# Patient Record
Sex: Female | Born: 1975 | Race: Black or African American | Marital: Married | Smoking: Never smoker
Health system: Southern US, Community
[De-identification: ages and names within clinical notes are randomized; demographics above are authoritative.]

## PROBLEM LIST (undated history)

## (undated) DIAGNOSIS — N83209 Unspecified ovarian cyst, unspecified side: Secondary | ICD-10-CM

## (undated) DIAGNOSIS — N200 Calculus of kidney: Secondary | ICD-10-CM

## (undated) HISTORY — PX: WISDOM TOOTH EXTRACTION: SHX21

## (undated) HISTORY — PX: LITHOTRIPSY: SUR834

---

## 2004-10-22 HISTORY — PX: SINUS SURGERY WITH INSTATRAK: SHX5215

## 2011-12-12 ENCOUNTER — Ambulatory Visit: Payer: Self-pay | Admitting: Family Medicine

## 2016-01-31 DIAGNOSIS — J040 Acute laryngitis: Secondary | ICD-10-CM | POA: Insufficient documentation

## 2016-01-31 DIAGNOSIS — Z9109 Other allergy status, other than to drugs and biological substances: Secondary | ICD-10-CM | POA: Insufficient documentation

## 2016-08-14 DIAGNOSIS — N200 Calculus of kidney: Secondary | ICD-10-CM | POA: Insufficient documentation

## 2016-08-21 DIAGNOSIS — G2581 Restless legs syndrome: Secondary | ICD-10-CM | POA: Insufficient documentation

## 2016-08-21 DIAGNOSIS — J301 Allergic rhinitis due to pollen: Secondary | ICD-10-CM | POA: Insufficient documentation

## 2016-08-21 DIAGNOSIS — J4530 Mild persistent asthma, uncomplicated: Secondary | ICD-10-CM | POA: Insufficient documentation

## 2016-11-12 DIAGNOSIS — J0101 Acute recurrent maxillary sinusitis: Secondary | ICD-10-CM | POA: Insufficient documentation

## 2017-05-13 DIAGNOSIS — N201 Calculus of ureter: Secondary | ICD-10-CM | POA: Insufficient documentation

## 2017-07-09 ENCOUNTER — Other Ambulatory Visit: Payer: Self-pay | Admitting: Allergy

## 2017-07-09 ENCOUNTER — Ambulatory Visit
Admission: RE | Admit: 2017-07-09 | Discharge: 2017-07-09 | Disposition: A | Discharge status: Home / Self Care | Payer: BLUE CROSS/BLUE SHIELD | Source: Ambulatory Visit | Attending: Allergy | Admitting: Allergy

## 2017-07-09 DIAGNOSIS — J4530 Mild persistent asthma, uncomplicated: Secondary | ICD-10-CM

## 2017-07-09 IMAGING — CR DG CHEST 2V
2 series · 2 of 2 positions shown · non-contrast
Comparison: None.

CLINICAL DATA: Abnormal breathing volume test.

EXAM:
CHEST  2 VIEW

[w chest pa]
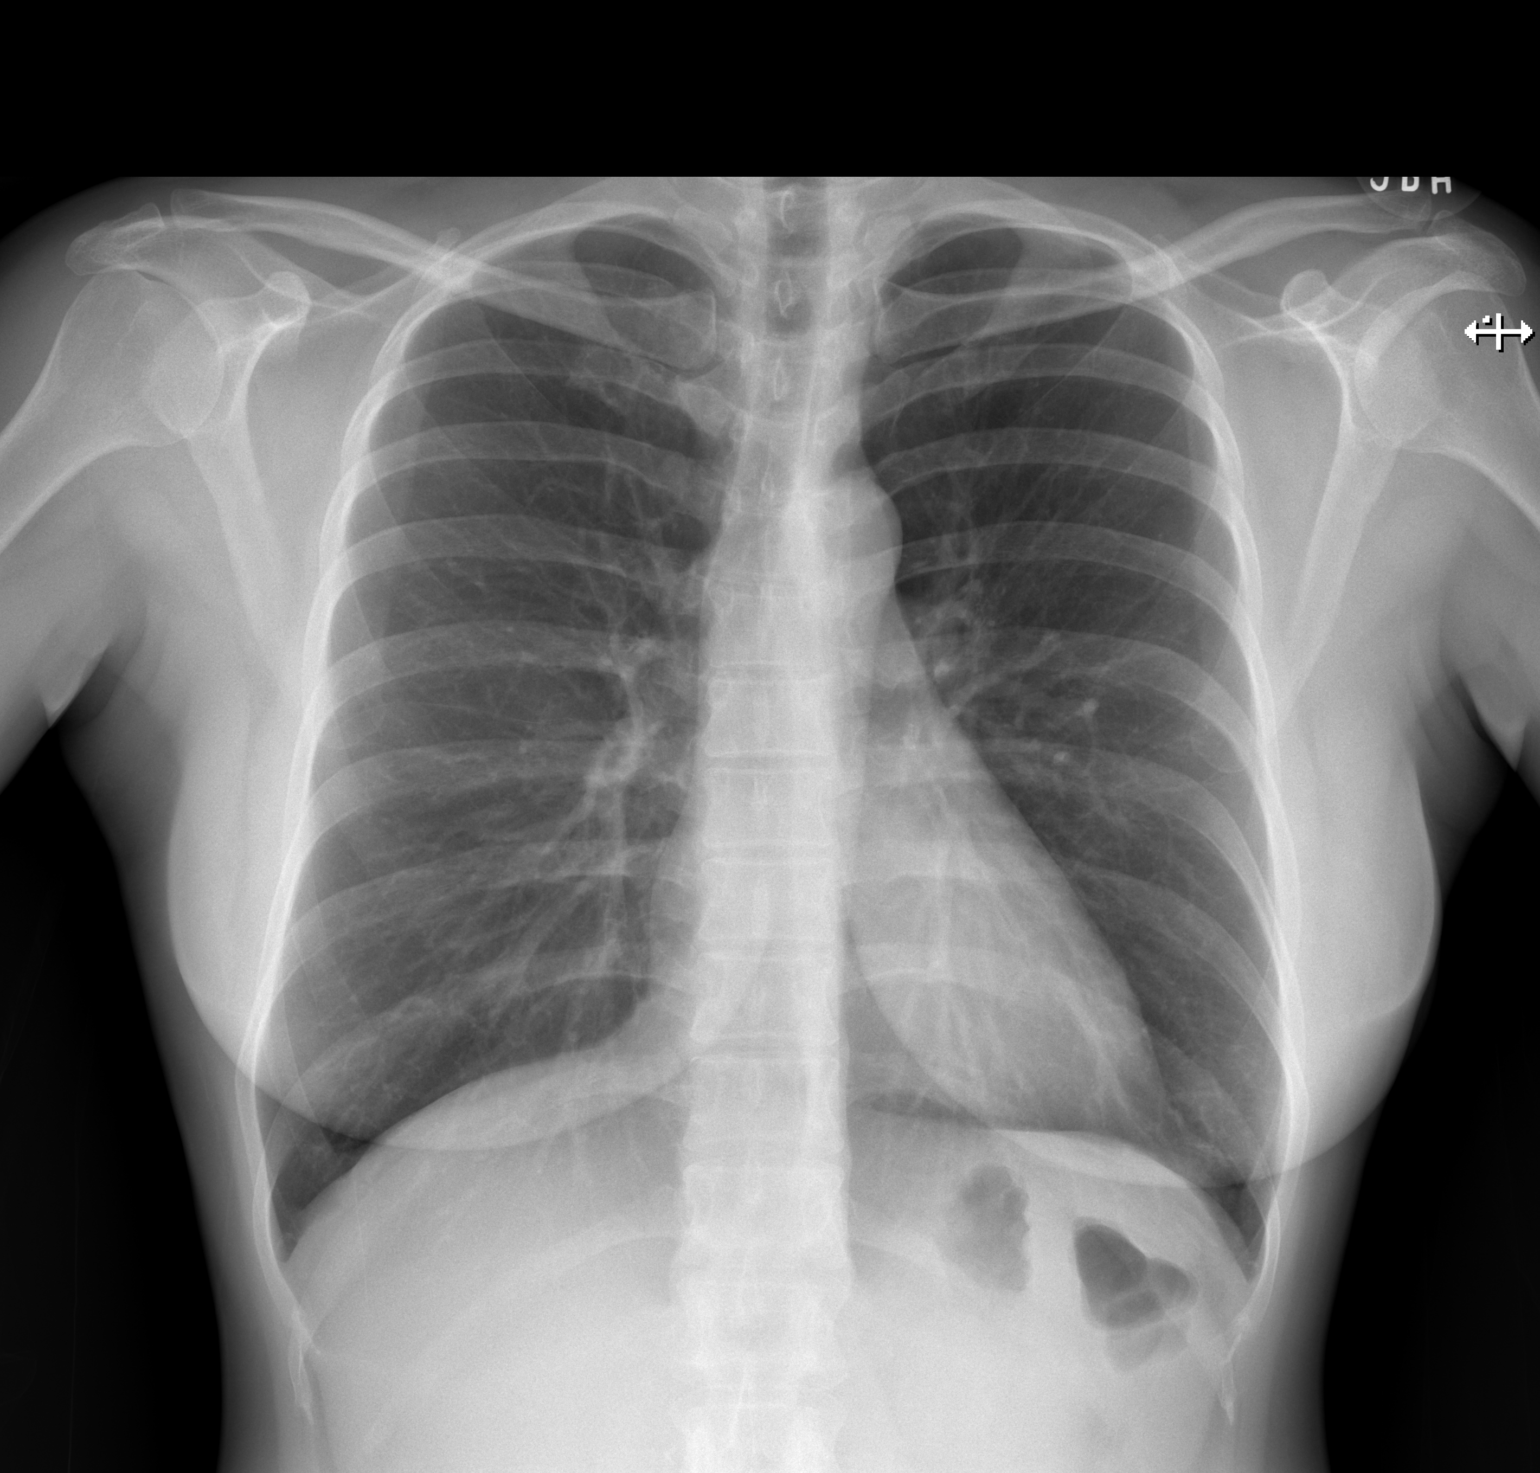

[w chest lat]
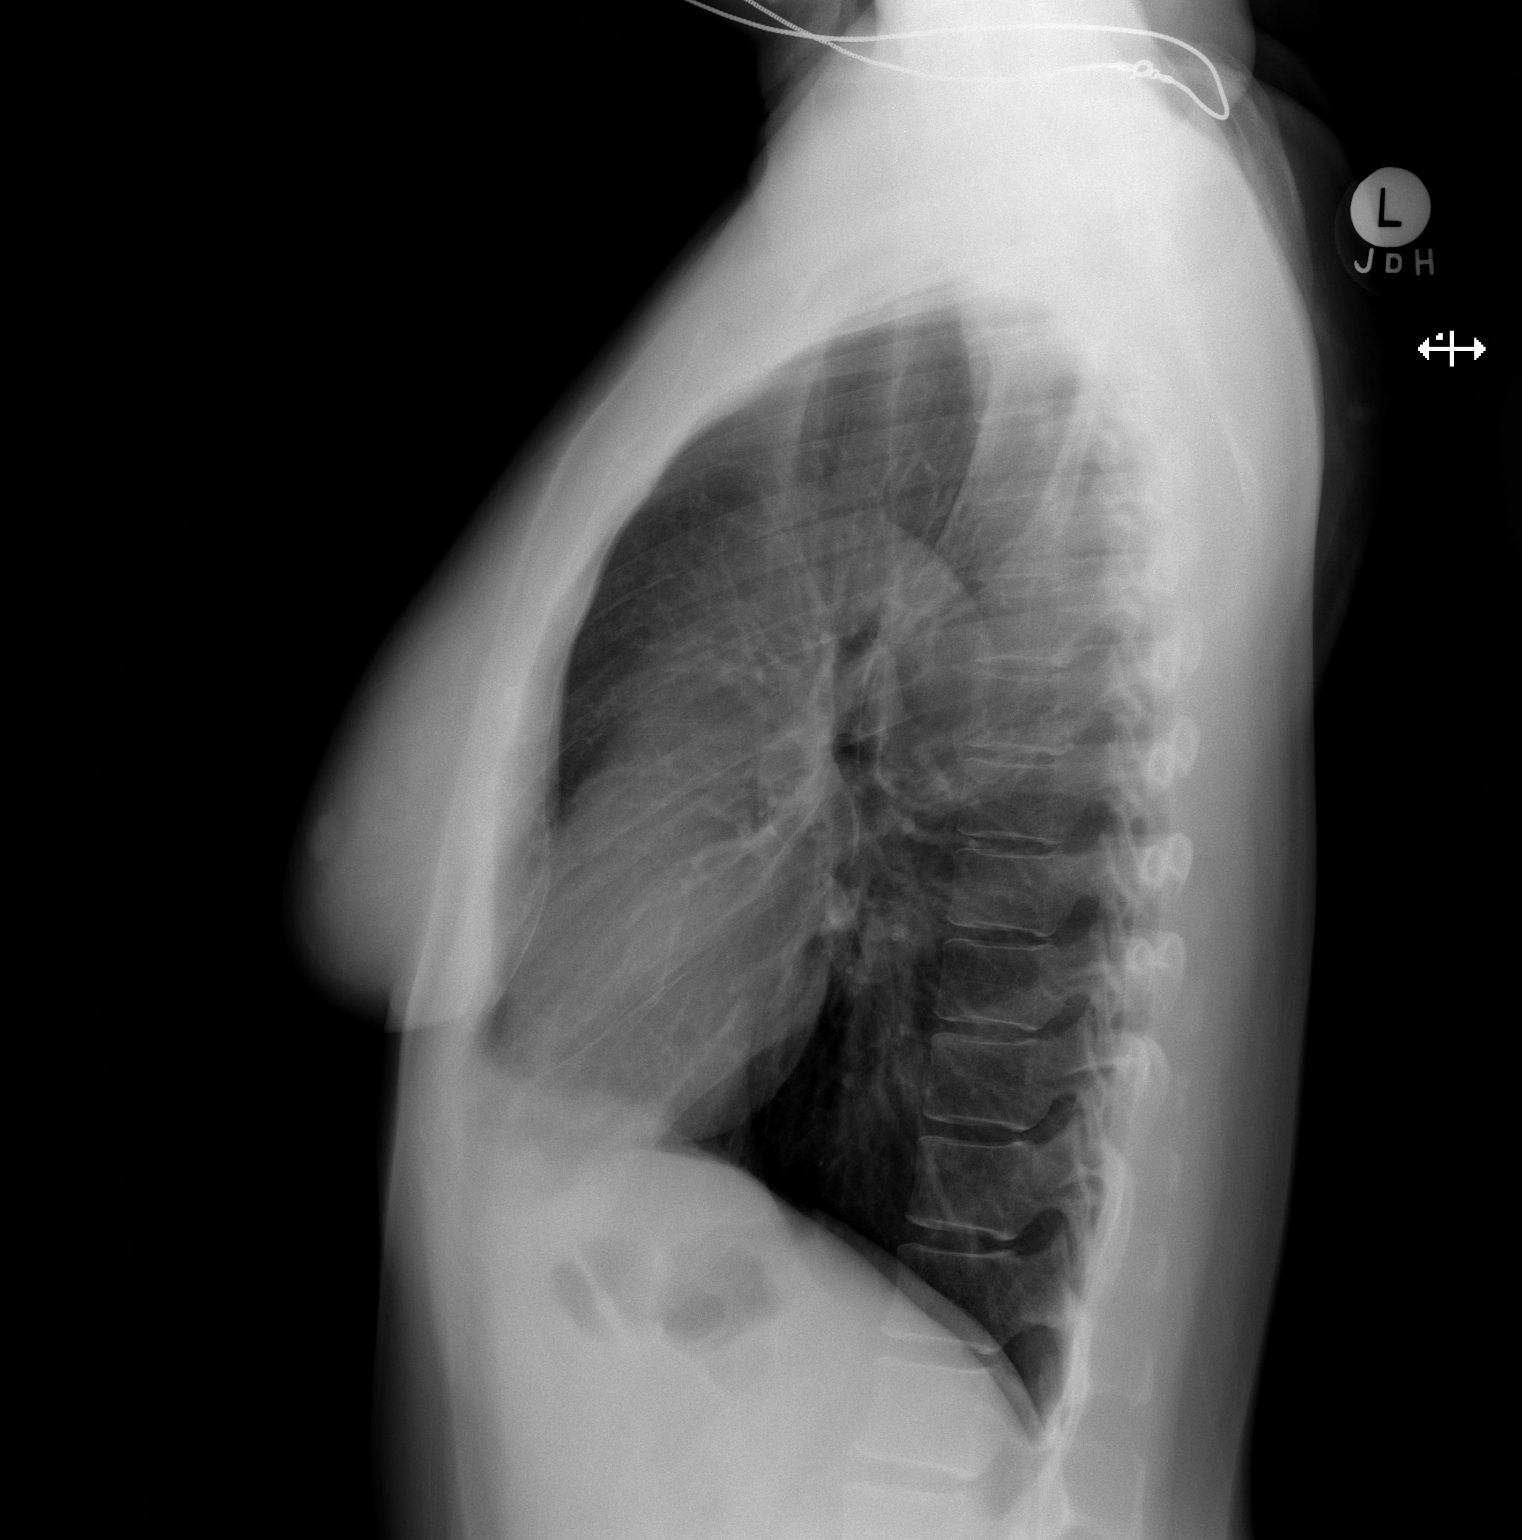

[2 of 2 positions shown; findings below may reference images not displayed]

FINDINGS: The heart size and mediastinal contours are within normal limits.
Both lungs are clear. No pneumothorax or pleural effusion is noted.
The visualized skeletal structures are unremarkable.
IMPRESSION: No active cardiopulmonary disease.

## 2017-08-20 DIAGNOSIS — R0989 Other specified symptoms and signs involving the circulatory and respiratory systems: Secondary | ICD-10-CM | POA: Insufficient documentation

## 2017-08-20 DIAGNOSIS — K219 Gastro-esophageal reflux disease without esophagitis: Secondary | ICD-10-CM | POA: Insufficient documentation

## 2018-11-27 ENCOUNTER — Emergency Department (HOSPITAL_COMMUNITY): Payer: BLUE CROSS/BLUE SHIELD

## 2018-11-27 ENCOUNTER — Other Ambulatory Visit: Payer: Self-pay

## 2018-11-27 ENCOUNTER — Emergency Department (HOSPITAL_COMMUNITY)
Admission: EM | Admit: 2018-11-27 | Discharge: 2018-11-27 | Disposition: A | Discharge status: Home / Self Care | Payer: BLUE CROSS/BLUE SHIELD | Attending: Emergency Medicine | Admitting: Emergency Medicine

## 2018-11-27 ENCOUNTER — Encounter (HOSPITAL_COMMUNITY): Payer: Self-pay

## 2018-11-27 DIAGNOSIS — R109 Unspecified abdominal pain: Secondary | ICD-10-CM | POA: Diagnosis present

## 2018-11-27 DIAGNOSIS — R1110 Vomiting, unspecified: Secondary | ICD-10-CM | POA: Insufficient documentation

## 2018-11-27 DIAGNOSIS — R112 Nausea with vomiting, unspecified: Secondary | ICD-10-CM | POA: Diagnosis not present

## 2018-11-27 DIAGNOSIS — Z79899 Other long term (current) drug therapy: Secondary | ICD-10-CM | POA: Diagnosis not present

## 2018-11-27 DIAGNOSIS — R1031 Right lower quadrant pain: Secondary | ICD-10-CM

## 2018-11-27 HISTORY — DX: Calculus of kidney: N20.0

## 2018-11-27 HISTORY — DX: Unspecified ovarian cyst, unspecified side: N83.209

## 2018-11-27 LAB — COMPREHENSIVE METABOLIC PANEL
ALT: 19 U/L (ref 0–44)
AST: 23 U/L (ref 15–41)
Albumin: 4 g/dL (ref 3.5–5.0)
Alkaline Phosphatase: 59 U/L (ref 38–126)
Anion gap: 11 (ref 5–15)
BUN: 16 mg/dL (ref 6–20)
CO2: 21 mmol/L — ABNORMAL LOW (ref 22–32)
Calcium: 9.1 mg/dL (ref 8.9–10.3)
Chloride: 106 mmol/L (ref 98–111)
Creatinine, Ser: 0.68 mg/dL (ref 0.44–1.00)
Glucose, Bld: 114 mg/dL — ABNORMAL HIGH (ref 70–99)
Potassium: 4.1 mmol/L (ref 3.5–5.1)
Sodium: 138 mmol/L (ref 135–145)
Total Bilirubin: 0.8 mg/dL (ref 0.3–1.2)
Total Protein: 7.3 g/dL (ref 6.5–8.1)

## 2018-11-27 LAB — URINALYSIS, ROUTINE W REFLEX MICROSCOPIC
Bilirubin Urine: NEGATIVE
Glucose, UA: NEGATIVE mg/dL
Hgb urine dipstick: NEGATIVE
Ketones, ur: NEGATIVE mg/dL
Leukocytes, UA: NEGATIVE
Nitrite: NEGATIVE
Protein, ur: NEGATIVE mg/dL
Specific Gravity, Urine: 1.031 — ABNORMAL HIGH (ref 1.005–1.030)
pH: 7 (ref 5.0–8.0)

## 2018-11-27 LAB — WET PREP, GENITAL
Clue Cells Wet Prep HPF POC: NONE SEEN
Sperm: NONE SEEN
Trich, Wet Prep: NONE SEEN
Yeast Wet Prep HPF POC: NONE SEEN

## 2018-11-27 LAB — LIPASE, BLOOD: Lipase: 33 U/L (ref 11–51)

## 2018-11-27 LAB — CBC
HCT: 49.1 % — ABNORMAL HIGH (ref 36.0–46.0)
Hemoglobin: 15 g/dL (ref 12.0–15.0)
MCH: 29.1 pg (ref 26.0–34.0)
MCHC: 30.5 g/dL (ref 30.0–36.0)
MCV: 95.2 fL (ref 80.0–100.0)
NRBC: 0 % (ref 0.0–0.2)
Platelets: 223 10*3/uL (ref 150–400)
RBC: 5.16 MIL/uL — AB (ref 3.87–5.11)
RDW: 14 % (ref 11.5–15.5)
WBC: 15.7 10*3/uL — AB (ref 4.0–10.5)

## 2018-11-27 LAB — PREGNANCY, URINE: Preg Test, Ur: NEGATIVE

## 2018-11-27 IMAGING — CT CT ABD-PELV W/ CM
2 of 5 series · 16 of 46 positions shown, 18 images · IV contrast (ISOVUE)
Comparison: None.

CLINICAL DATA: Abdominal pain, appendicitis.

EXAM:
CT ABDOMEN AND PELVIS WITH CONTRAST
TECHNIQUE: Multidetector CT imaging of the abdomen and pelvis was performed
using the standard protocol following bolus administration of
intravenous contrast.
CONTRAST:  100mL [13] IOPAMIDOL ([13]) INJECTION 61%

[Series 2: axial st · axial · 0.68mm/px · z∈[-470,-60]mm · 13 of 94 slices shown, 15 images]
[im 6/94  soft-tissue]
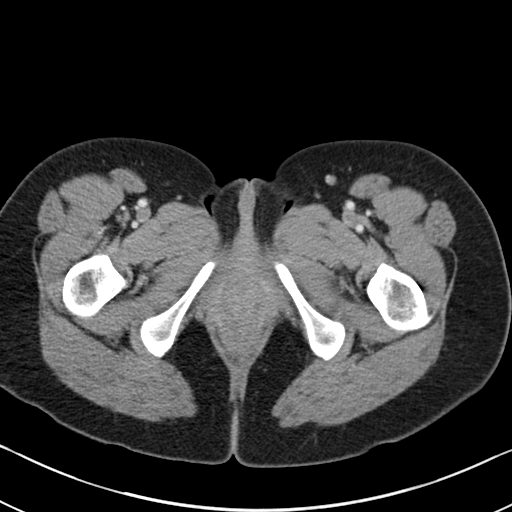
[im 6/94  bone]
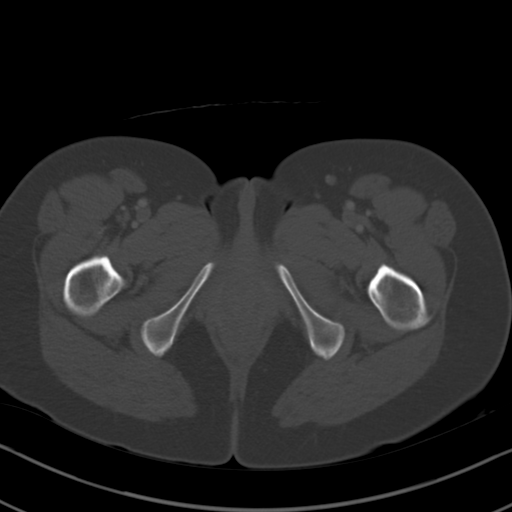
[im 12/94  soft-tissue]
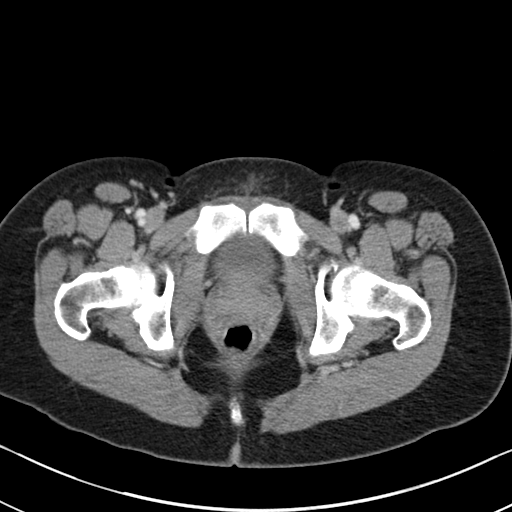
[im 18/94  soft-tissue]
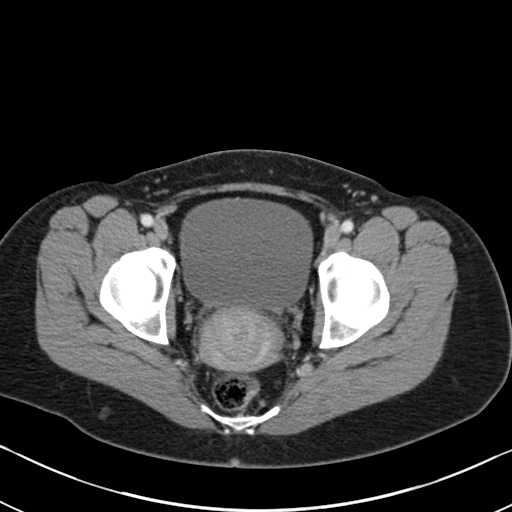
[im 30/94  soft-tissue]
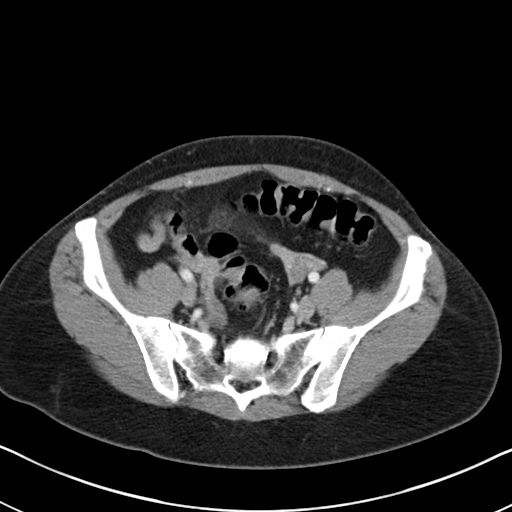
[im 35/94  soft-tissue]
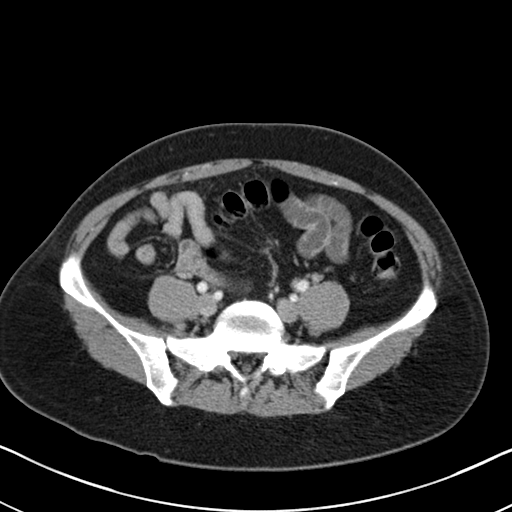
[im 41/94  soft-tissue]
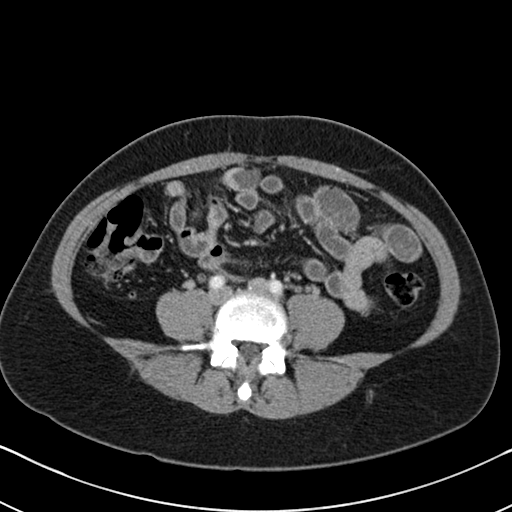
[im 47/94  soft-tissue]
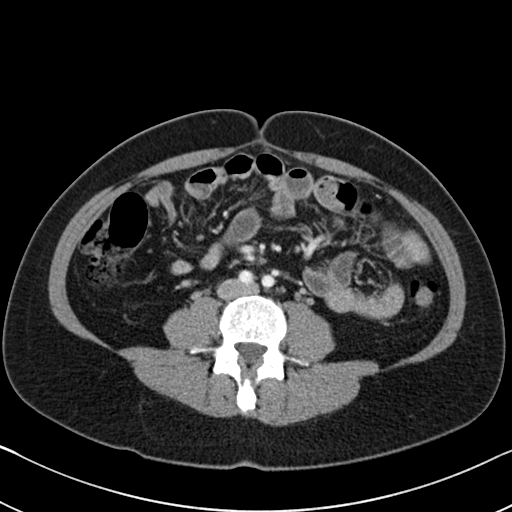
[im 53/94  soft-tissue]
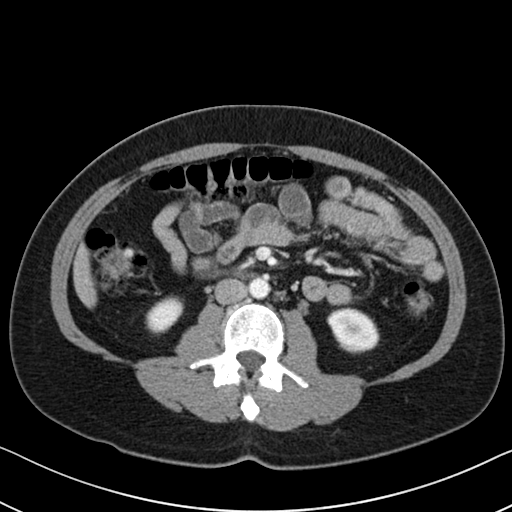
[im 59/94  soft-tissue]
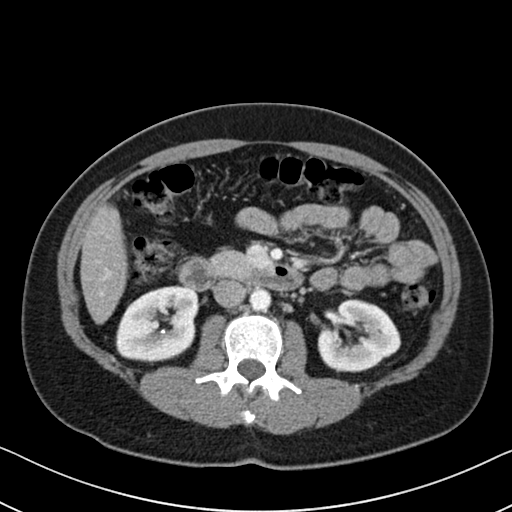
[im 59/94  bone]
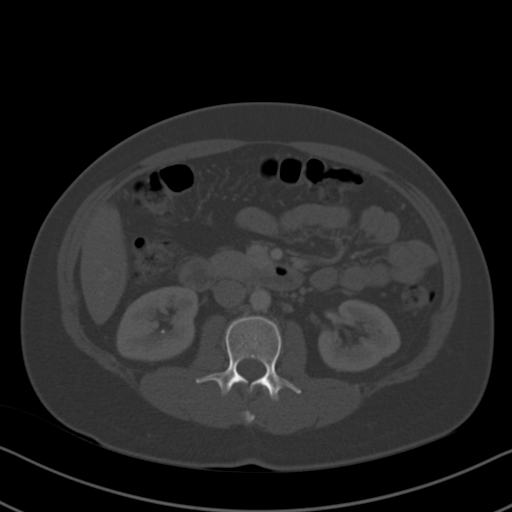
[im 64/94  soft-tissue]
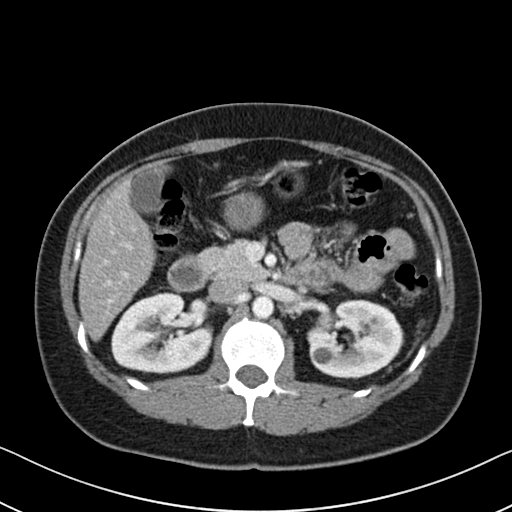
[im 76/94  soft-tissue]
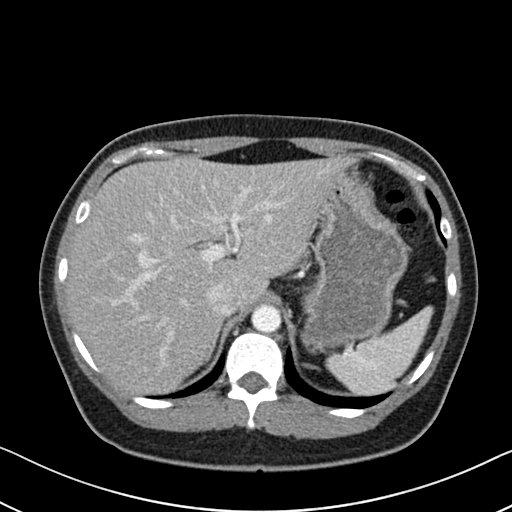
[im 82/94  soft-tissue]
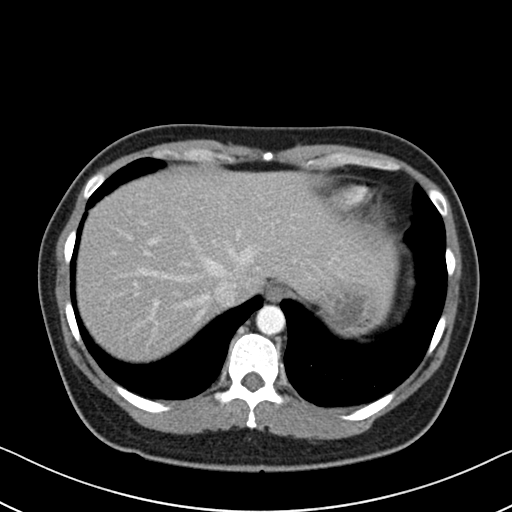
[im 88/94  soft-tissue]
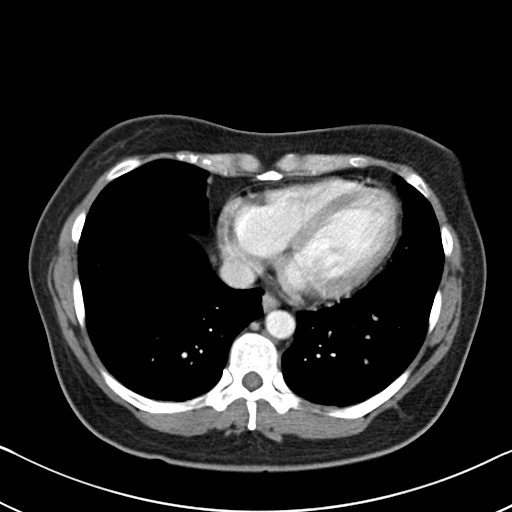

[Series 5: coronal st · coronal · 0.71mm/px · 3 of 101 slices shown]
[im 34/101  soft-tissue]
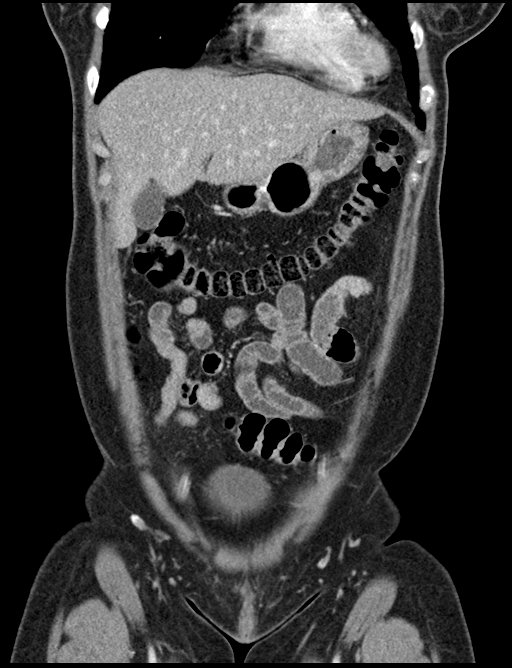
[im 45/101  soft-tissue]
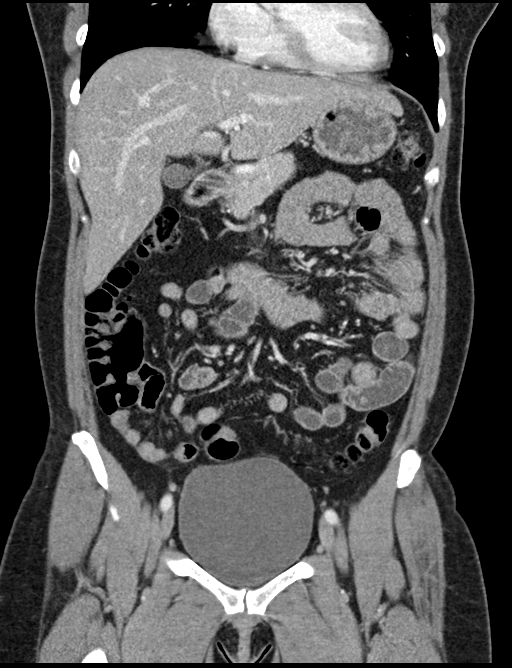
[im 56/101  soft-tissue]
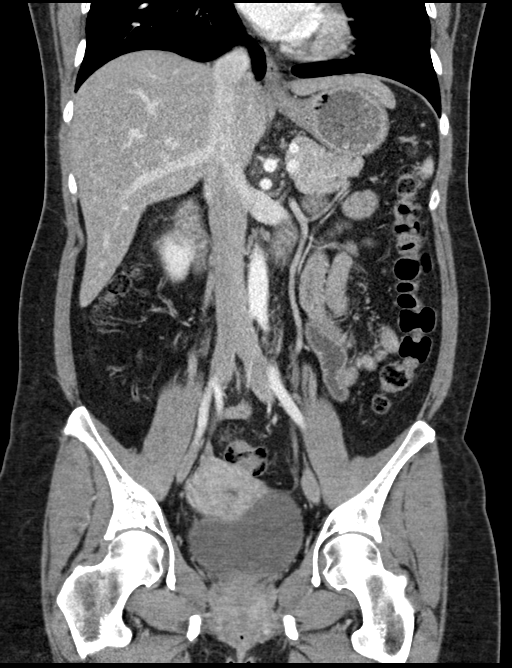

[16 of 46 positions shown; findings below may reference images not displayed]

FINDINGS: Lower chest: No acute abnormality.

Hepatobiliary: No focal liver abnormality is seen. Hepatic
steatosis. No gallstones, gallbladder wall thickening, or biliary
dilatation.

Pancreas: Unremarkable. No pancreatic ductal dilatation or
surrounding inflammatory changes.

Spleen: Normal in size without focal abnormality.

Adrenals/Urinary Tract: Adrenal glands are unremarkable.
Nonobstructive inferior pole calculus of the right kidney. Bladder
is unremarkable.

Stomach/Bowel: Stomach is within normal limits. Appendix appears
normal. No evidence of bowel wall thickening, distention, or
inflammatory changes.

Vascular/Lymphatic: No significant vascular findings are present. No
enlarged abdominal or pelvic lymph nodes.

Reproductive: No mass or other abnormality.

Other: No abdominal wall hernia or abnormality. No abdominopelvic
ascites.

Musculoskeletal: No acute or significant osseous findings.
IMPRESSION: 1. No acute CT findings of the abdomen or pelvis to explain pain.
Normal appendix.

2.  Incidental findings as detailed above.

## 2018-11-27 IMAGING — US US TRANSVAGINAL NON-OB
1 series · 13 of 25 positions shown · non-contrast
Comparison: CT Abdomen and Pelvis earlier today.

CLINICAL DATA: 42-year-old female with right lower quadrant, right
pelvic pain since [NI] hours. Unrevealing CT Abdomen and Pelvis
earlier today.

EXAM:
TRANSABDOMINAL AND TRANSVAGINAL ULTRASOUND OF PELVIS
DOPPLER ULTRASOUND OF OVARIES
TECHNIQUE: Both transabdominal and transvaginal ultrasound examinations of the
pelvis were performed. Transabdominal technique was performed for
global imaging of the pelvis including uterus, ovaries, adnexal
regions, and pelvic cul-de-sac.
It was necessary to proceed with endovaginal exam following the
transabdominal exam to visualize the ovaries. Color and duplex
Doppler ultrasound was utilized to evaluate blood flow to the
ovaries.

[Series 1: us transvaginal non-ob · 13 of 179 slices shown]
[im 1/179]
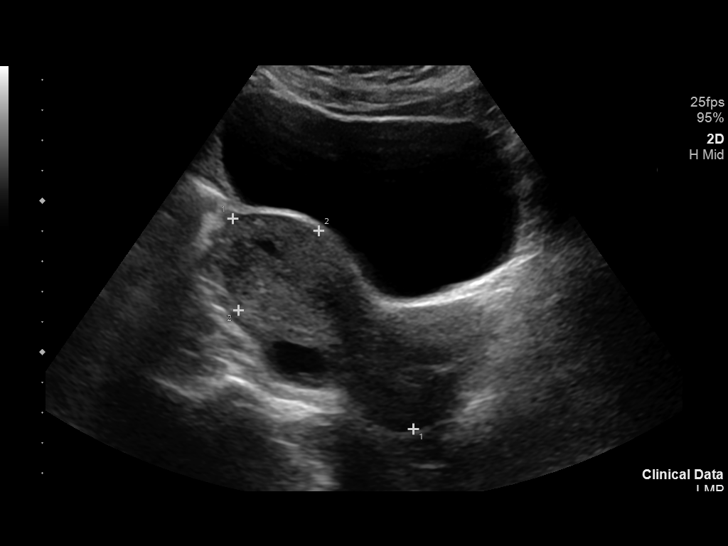
[im 15/179]
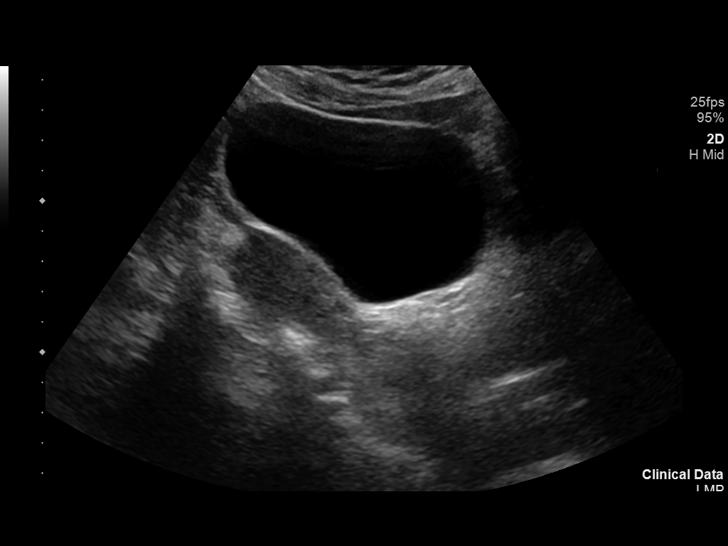
[im 30/179]
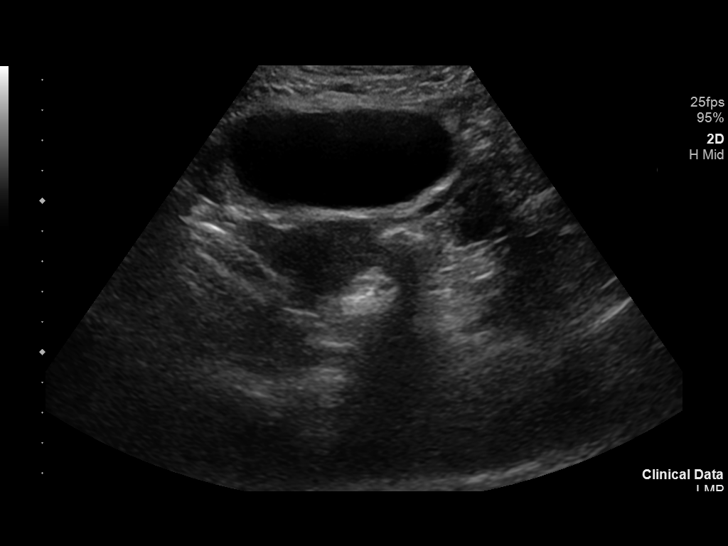
[im 45/179]
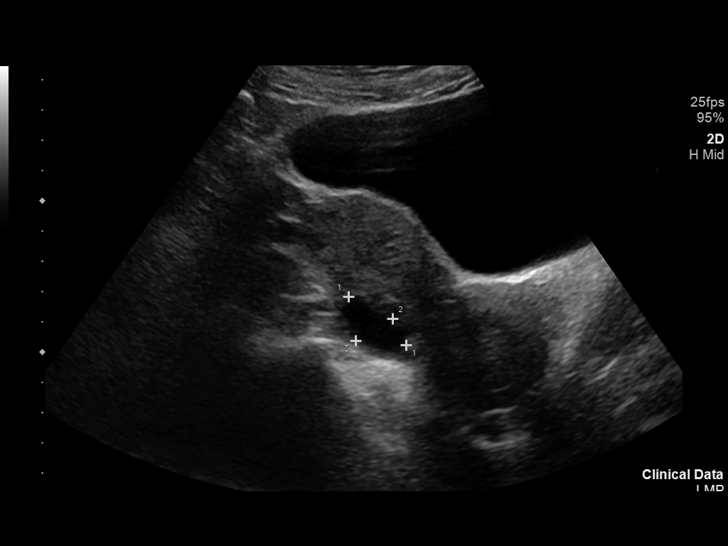
[im 60/179]
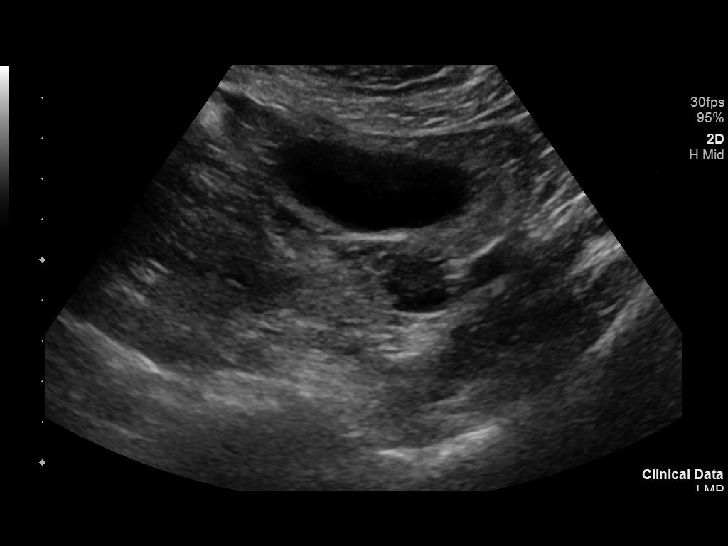
[im 75/179]
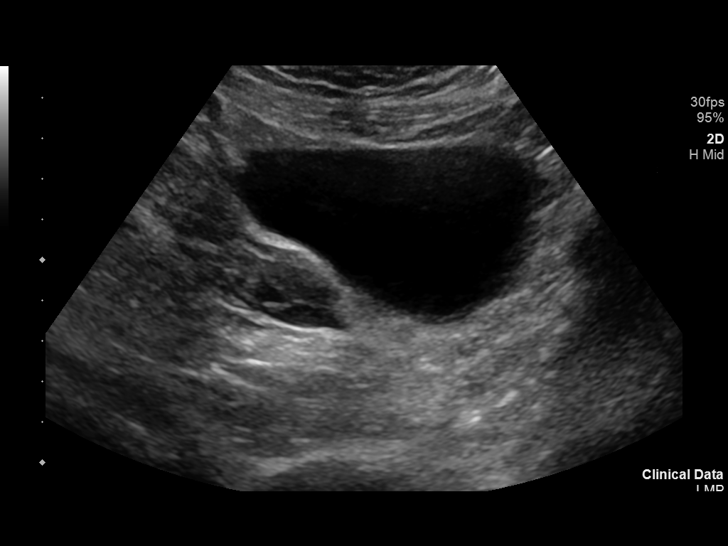
[im 90/179]
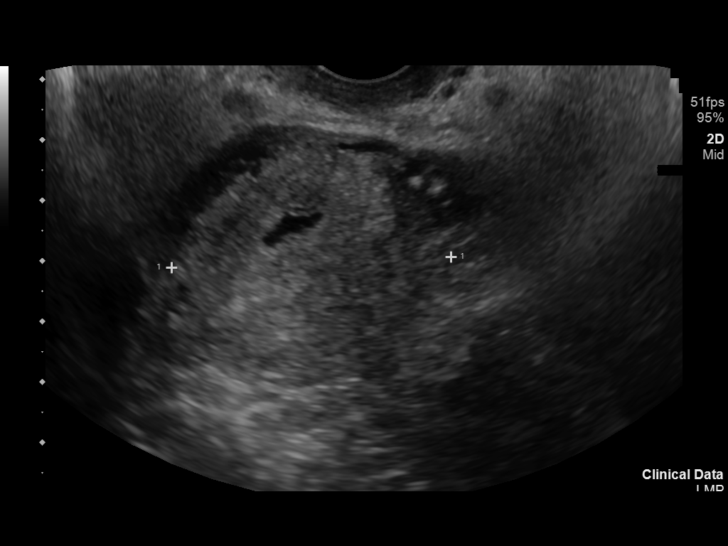
[im 104/179]
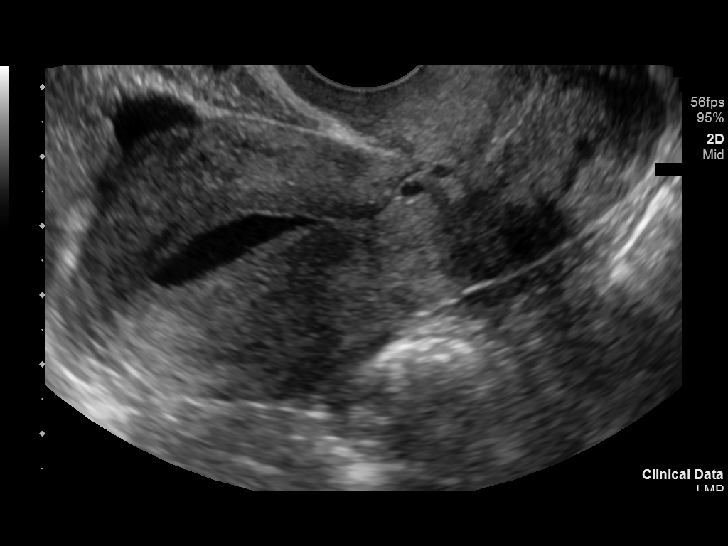
[im 119/179]
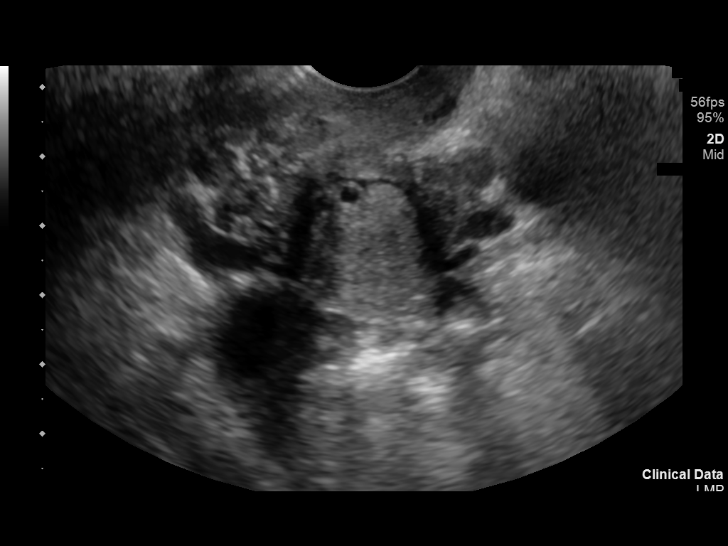
[im 134/179]
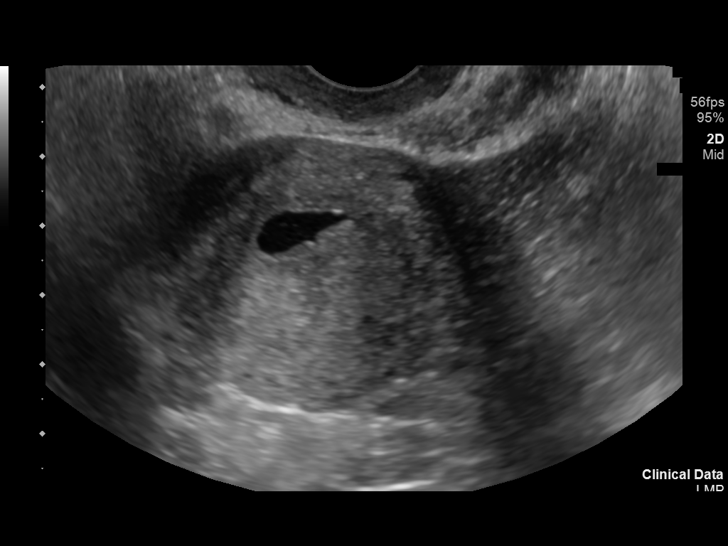
[im 149/179]
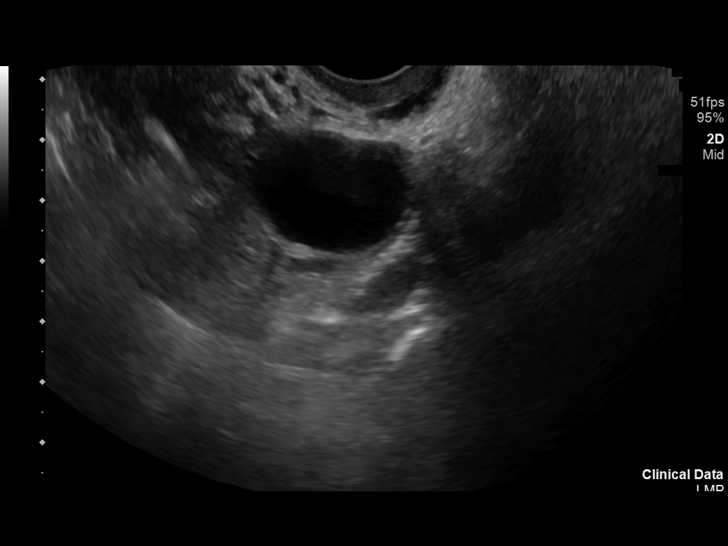
[im 164/179]
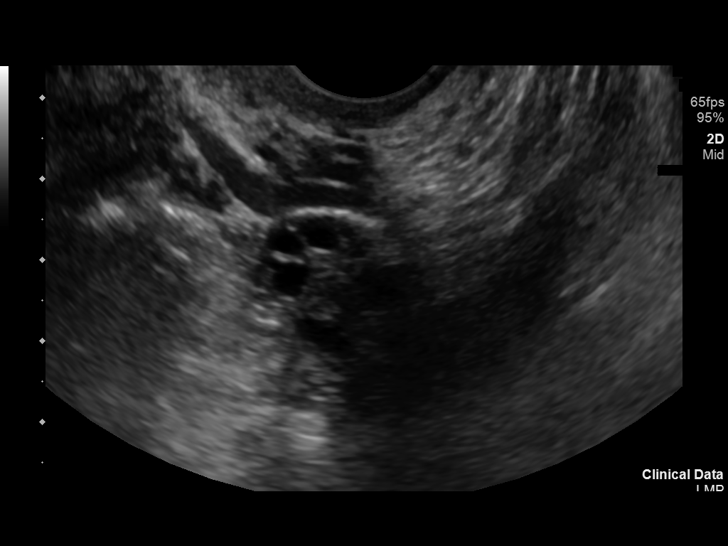
[im 179/179]
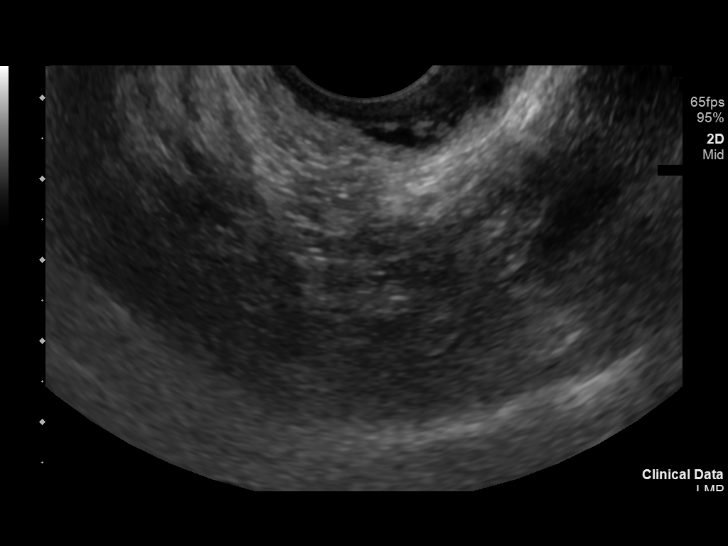

[13 of 25 positions shown; findings below may reference images not displayed]

FINDINGS: Uterus

Measurements: 9.2 x 3.7 x 4.5 centimeters = volume: 81 mL. No
fibroids or other mass visualized. There is a tiny 3 mm cystic area
in the ventral myometrium located near a C-section scar, which
appears inconsequential.

Endometrium

Thickness: 5 mm. Fluid within the endometrial canal (images 48, 53),
but no other focal abnormality visualized.

Right ovary

Measurements: 3.4 x 2.5 x 2.9 centimeters = volume: 13 mL. Simple
appearing 2.4 centimeter cyst with no vascular elements. Several
smaller cysts or follicles.

Left ovary

Measurements: 2.0 x 1.4 x 1.5 centimeters = volume: 2 mL. Normal,
several tiny cysts or follicles.

Pulsed Doppler evaluation of both ovaries demonstrates normal
low-resistance arterial and venous waveforms.

Other findings

Trace if any pelvic free fluid.
IMPRESSION: Fluid within the endometrial canal, presumably physiologic.

No evidence of ovarian torsion and otherwise normal/physiologic
appearance of the uterus and ovaries.

## 2018-11-27 MED ORDER — IOPAMIDOL (ISOVUE-300) INJECTION 61%
100.0000 mL | Freq: Once | INTRAVENOUS | Status: AC | PRN
  Administered 2018-11-27: 100 mL via INTRAVENOUS

## 2018-11-27 MED ORDER — SODIUM CHLORIDE (PF) 0.9 % IJ SOLN
INTRAMUSCULAR | Status: AC
  Filled 2018-11-27: qty 50

## 2018-11-27 MED ORDER — SODIUM CHLORIDE 0.9 % IV BOLUS
1000.0000 mL | Freq: Once | INTRAVENOUS | Status: AC
  Administered 2018-11-27: 1000 mL via INTRAVENOUS

## 2018-11-27 MED ORDER — ONDANSETRON HCL 4 MG/2ML IJ SOLN
4.0000 mg | Freq: Once | INTRAMUSCULAR | Status: AC
  Administered 2018-11-27: 4 mg via INTRAVENOUS
  Filled 2018-11-27: qty 2

## 2018-11-27 MED ORDER — IOPAMIDOL (ISOVUE-300) INJECTION 61%
INTRAVENOUS | Status: AC
  Filled 2018-11-27: qty 100

## 2018-11-27 MED ORDER — MORPHINE SULFATE (PF) 4 MG/ML IV SOLN
4.0000 mg | Freq: Once | INTRAVENOUS | Status: AC
  Administered 2018-11-27: 4 mg via INTRAVENOUS
  Filled 2018-11-27: qty 1

## 2018-11-27 MED ORDER — SODIUM CHLORIDE 0.9% FLUSH
3.0000 mL | Freq: Once | INTRAVENOUS | Status: AC
  Administered 2018-11-27: 3 mL via INTRAVENOUS

## 2018-11-27 MED ORDER — ONDANSETRON 4 MG PO TBDP: 4.0000 mg | ORAL_TABLET | Freq: Three times a day (TID) | ORAL | 0 refills | Status: DC | PRN

## 2018-11-27 NOTE — Discharge Instructions (Signed)
Your CT scan and pelvic ultrasound are normal today. It is recommended that you follow-up with your gynecologist/primary care provider regarding your visit today. You can take Zofran every 8 hours as needed for nausea. You can take ibuprofen and alternate with Tylenol as needed for pain. Return to the emergency department if you develop severely worsening abdominal pain, high fever, uncontrollable vomiting, or new or concerning symptoms.

## 2018-11-27 NOTE — ED Notes (Signed)
Pt and visitor verbalized discharge instructions and follow up care. Alert and ambulatory  

## 2018-11-27 NOTE — ED Triage Notes (Addendum)
Pt reports sharp lower right sided abd pain with N/V since 2am last night. Pt reports that she has had an ovarian cyst in the past but this pain is different. Pt states that she has been taking an antibiotic since Tuesday for a sinus infection. Pt reports hx of kidney stones but denies urinary symptoms at this time. Denies any fever or diarrhea.

## 2018-11-27 NOTE — ED Provider Notes (Signed)
[facility]-EMERGENCY DEPT Provider Note   CSN: [id number] Arrival date & time: 11/27/18  1153     History   Chief Complaint Chief Complaint  Patient presents with  . Abdominal Pain  . Emesis    HPI Catherine Bryan is a 43 y.o. female with past medical history of nephrolithiasis, ovarian cyst, presenting to the emergency department with complaint of acute onset of right lower quadrant abdominal pain that began around 2 AM this morning.  Patient states she woke up from sleep with abdominal pain and fullness, attempted to have a bowel movement which was loose.  .  (She states her bowel movements have been loose since she began taking cefdinir on Tuesday for a sinus infection.)  She states she is also had nausea and vomiting since this morning, nonbloody nonbilious.  She states the severity of the pain is similar to her history of nephrolithiasis, however the quality is different.  Pain intermittently stabbing, radiates to epigastric region, worse with movement.  Also made constant if she lays on her right side.  She states her history of ovarian cysts feel more like a discomfort as opposed to this severe pain, therefore does not feel similar.  No medications tried for symptoms prior to arrival.  Denies fevers, chills, vaginal bleeding or discharge.  LMP May 2, however is chronically irregular, takes OCPs.  The history is provided by the patient.    Past Medical History:  Diagnosis Date  . Kidney stones   . Ovarian cyst     There are no active problems to display for this patient.   History reviewed. No pertinent surgical history.   OB History   No obstetric history on file.      Home Medications    Prior to Admission medications   Medication Sig Start Date End Date Taking? Authorizing Provider  azelastine (ASTELIN) 0.1 % nasal spray Place 1 spray into both nostrils 2 (two) times daily. Use in each nostril as directed   Yes [provider]    azelastine (OPTIVAR) 0.05 % ophthalmic solution Place 1 drop into both eyes 2 (two) times daily.   Yes [provider]  cefdinir (OMNICEF) 300 MG capsule Take 300 mg by mouth 2 (two) times daily.   Yes [provider]  famotidine (PEPCID) 40 MG tablet Take 40 mg by mouth daily.   Yes [provider]  fluocinonide cream (LIDEX) 0.05 % Apply 1 application topically 2 (two) times daily as needed. Eczema. 10/13/18  Yes [provider]  fluticasone (CUTIVATE) 0.05 % cream Apply 1 application topically 2 (two) times daily as needed. Eczema. 10/14/18  Yes [provider]  fluticasone (FLONASE) 50 MCG/ACT nasal spray Place 1 spray into both nostrils 2 (two) times daily.   Yes [provider]  fluticasone furoate-vilanterol (BREO ELLIPTA) 200-25 MCG/INH AEPB Inhale 1 puff into the lungs daily.   Yes [provider]  levocetirizine (XYZAL) 5 MG tablet Take 5 mg by mouth every evening.   Yes [provider]  levonorgestrel-ethinyl estradiol (SEASONALE,INTROVALE,JOLESSA) 0.15-0.03 MG tablet Take 1 tablet by mouth daily. 11/18/18  Yes [provider]  metaxalone (SKELAXIN) 800 MG tablet Take 800 mg by mouth 2 (two) times daily as needed for muscle spasms. 10/14/18  Yes [provider]  montelukast (SINGULAIR) 10 MG tablet Take 10 mg by mouth at bedtime.   Yes [provider]  rizatriptan (MAXALT) 10 MG tablet Take 10 mg by mouth as needed for migraine. May  repeat once in 2 hours, if needed. 11/15/18  Yes [provider]  zonisamide (ZONEGRAN) 50 MG capsule Take 150 mg by mouth daily.   Yes [provider]  ondansetron (ZOFRAN ODT) 4 MG disintegrating tablet Take 1 tablet (4 mg total) by mouth every 8 (eight) hours as needed for nausea or vomiting. 11/27/18   Devonte Migues, [place] N, PA-C    Family History History reviewed. No pertinent family history.  Social History Social History   Tobacco Use  .  Smoking status: Not on file  Substance Use Topics  . Alcohol use: Not on file  . Drug use: Not on file     Allergies   Patient has no known allergies.   Review of Systems Review of Systems  Constitutional: Negative for fever.  Gastrointestinal: Positive for abdominal pain, diarrhea, nausea and vomiting.  Genitourinary: Negative for dysuria, frequency, vaginal bleeding and vaginal discharge.  All other systems reviewed and are negative.    Physical Exam Updated Vital Signs BP 112/78   Pulse (!) 103   Temp 98.6 F (37 C)   Resp 16   LMP 03/17/2018 Comment: preg device/med  SpO2 99%   Physical Exam Vitals signs and nursing note reviewed. Exam conducted with a chaperone present.  Constitutional:      Appearance: She is well-developed. She is not ill-appearing.  HENT:     Head: Normocephalic and atraumatic.     Mouth/Throat:     Mouth: Mucous membranes are moist.  Eyes:     Conjunctiva/sclera: Conjunctivae normal.  Cardiovascular:     Rate and Rhythm: Regular rhythm. Tachycardia present.  Pulmonary:     Effort: Pulmonary effort is normal.     Breath sounds: Normal breath sounds.  Abdominal:     General: Bowel sounds are normal.     Palpations: Abdomen is soft.     Tenderness: There is abdominal tenderness in the right upper quadrant, right lower quadrant, epigastric area, periumbilical area and suprapubic area. There is no guarding or rebound. Positive signs include McBurney's sign. Negative signs include Rovsing's sign.     Comments: Tenderness worse in the right lower quadrant.  Genitourinary:    Labia:        Right: No rash or tenderness.        Left: No rash or tenderness.      Vagina: Normal.     Cervix: Discharge present. No cervical motion tenderness.     Uterus: Normal.      Adnexa:        Right: Tenderness present. No fullness.         Left: No tenderness or fullness.       Comments: Exam performed with female nurse tech chaperone present.  Scant amount  of white/clear discharge. Skin:    General: Skin is warm.  Neurological:     Mental Status: She is alert.  Psychiatric:        Behavior: Behavior normal.      ED Treatments / Results  Labs (all labs ordered are listed, but only abnormal results are displayed) Labs Reviewed  WET PREP, GENITAL - Abnormal; Notable for the following components:      Result Value   WBC, Wet Prep HPF POC MANY (*)    All other components within normal limits  COMPREHENSIVE METABOLIC PANEL - Abnormal; Notable for the following components:   CO2 21 (*)    Glucose, Bld 114 (*)    All other components within normal limits  CBC -  Abnormal; Notable for the following components:   WBC 15.7 (*)    RBC 5.16 (*)    HCT 49.1 (*)    All other components within normal limits  URINALYSIS, ROUTINE W REFLEX MICROSCOPIC - Abnormal; Notable for the following components:   Color, Urine STRAW (*)    Specific Gravity, Urine 1.031 (*)    All other components within normal limits  LIPASE, BLOOD  PREGNANCY, URINE    EKG None  Radiology US Transvaginal Non-ob  Result Date: 11/27/2018 CLINICAL DATA:  43 year old female with right lower quadrant, right pelvic pain since 0200 hours. Unrevealing CT Abdomen and Pelvis earlier today. EXAM: TRANSABDOMINAL AND TRANSVAGINAL ULTRASOUND OF PELVIS DOPPLER ULTRASOUND OF OVARIES TECHNIQUE: Both transabdominal and transvaginal ultrasound examinations of the pelvis were performed. Transabdominal technique was performed for global imaging of the pelvis including uterus, ovaries, adnexal regions, and pelvic cul-de-sac. It was necessary to proceed with endovaginal exam following the transabdominal exam to visualize the ovaries. Color and duplex Doppler ultrasound was utilized to evaluate blood flow to the ovaries. COMPARISON:  CT Abdomen and Pelvis earlier today. FINDINGS: Uterus Measurements: 9.2 x 3.7 x 4.5 centimeters = volume: 81 mL. No fibroids or other mass visualized. There is a tiny 3  mm cystic area in the ventral myometrium located near a C-section scar, which appears inconsequential. Endometrium Thickness: 5 mm. Fluid within the endometrial canal (images 48, 53), but no other focal abnormality visualized. Right ovary Measurements: 3.4 x 2.5 x 2.9 centimeters = volume: 13 mL. Simple appearing 2.4 centimeter cyst with no vascular elements. Several smaller cysts or follicles. Left ovary Measurements: 2.0 x 1.4 x 1.5 centimeters = volume: 2 mL. Normal, several tiny cysts or follicles. Pulsed Doppler evaluation of both ovaries demonstrates normal low-resistance arterial and venous waveforms. Other findings Trace if any pelvic free fluid. IMPRESSION: Fluid within the endometrial canal, presumably physiologic. No evidence of ovarian torsion and otherwise normal/physiologic appearance of the uterus and ovaries. Electronically Signed   By: Odessa Fleming M.D.   On: 11/27/2018 18:45   US Pelvis Complete  Result Date: 11/27/2018 CLINICAL DATA:  43 year old female with right lower quadrant, right pelvic pain since 0200 hours. Unrevealing CT Abdomen and Pelvis earlier today. EXAM: TRANSABDOMINAL AND TRANSVAGINAL ULTRASOUND OF PELVIS DOPPLER ULTRASOUND OF OVARIES TECHNIQUE: Both transabdominal and transvaginal ultrasound examinations of the pelvis were performed. Transabdominal technique was performed for global imaging of the pelvis including uterus, ovaries, adnexal regions, and pelvic cul-de-sac. It was necessary to proceed with endovaginal exam following the transabdominal exam to visualize the ovaries. Color and duplex Doppler ultrasound was utilized to evaluate blood flow to the ovaries. COMPARISON:  CT Abdomen and Pelvis earlier today. FINDINGS: Uterus Measurements: 9.2 x 3.7 x 4.5 centimeters = volume: 81 mL. No fibroids or other mass visualized. There is a tiny 3 mm cystic area in the ventral myometrium located near a C-section scar, which appears inconsequential. Endometrium Thickness: 5 mm. Fluid  within the endometrial canal (images 48, 53), but no other focal abnormality visualized. Right ovary Measurements: 3.4 x 2.5 x 2.9 centimeters = volume: 13 mL. Simple appearing 2.4 centimeter cyst with no vascular elements. Several smaller cysts or follicles. Left ovary Measurements: 2.0 x 1.4 x 1.5 centimeters = volume: 2 mL. Normal, several tiny cysts or follicles. Pulsed Doppler evaluation of both ovaries demonstrates normal low-resistance arterial and venous waveforms. Other findings Trace if any pelvic free fluid. IMPRESSION: Fluid within the endometrial canal, presumably physiologic. No evidence of ovarian torsion and otherwise  normal/physiologic appearance of the uterus and ovaries. Electronically Signed   By: Odessa Fleming M.D.   On: 11/27/2018 18:45   Ct Abdomen Pelvis W Contrast  Result Date: 11/27/2018 CLINICAL DATA:  Abdominal pain, appendicitis. EXAM: CT ABDOMEN AND PELVIS WITH CONTRAST TECHNIQUE: Multidetector CT imaging of the abdomen and pelvis was performed using the standard protocol following bolus administration of intravenous contrast. CONTRAST:  ISOVUE-300 IOPAMIDOL (ISOVUE-300) INJECTION 61% COMPARISON:  None. FINDINGS: Lower chest: No acute abnormality. Hepatobiliary: No focal liver abnormality is seen. Hepatic steatosis. No gallstones, gallbladder wall thickening, or biliary dilatation. Pancreas: Unremarkable. No pancreatic ductal dilatation or surrounding inflammatory changes. Spleen: Normal in size without focal abnormality. Adrenals/Urinary Tract: Adrenal glands are unremarkable. Nonobstructive inferior pole calculus of the right kidney. Bladder is unremarkable. Stomach/Bowel: Stomach is within normal limits. Appendix appears normal. No evidence of bowel wall thickening, distention, or inflammatory changes. Vascular/Lymphatic: No significant vascular findings are present. No enlarged abdominal or pelvic lymph nodes. Reproductive: No mass or other abnormality. Other: No abdominal wall  hernia or abnormality. No abdominopelvic ascites. Musculoskeletal: No acute or significant osseous findings. IMPRESSION: 1. No acute CT findings of the abdomen or pelvis to explain pain. Normal appendix. 2.  Incidental findings as detailed above. Electronically Signed   By: Lauralyn Primes M.D.   On: 11/27/2018 15:21   [place] Art/ven Flow Abd Pelv Doppler  Result Date: 11/27/2018 CLINICAL DATA:  43 year old female with right lower quadrant, right pelvic pain since 0200 hours. Unrevealing CT Abdomen and Pelvis earlier today. EXAM: TRANSABDOMINAL AND TRANSVAGINAL ULTRASOUND OF PELVIS DOPPLER ULTRASOUND OF OVARIES TECHNIQUE: Both transabdominal and transvaginal ultrasound examinations of the pelvis were performed. Transabdominal technique was performed for global imaging of the pelvis including uterus, ovaries, adnexal regions, and pelvic cul-de-sac. It was necessary to proceed with endovaginal exam following the transabdominal exam to visualize the ovaries. Color and duplex Doppler ultrasound was utilized to evaluate blood flow to the ovaries. COMPARISON:  CT Abdomen and Pelvis earlier today. FINDINGS: Uterus Measurements: 9.2 x 3.7 x 4.5 centimeters = volume: 81 mL. No fibroids or other mass visualized. There is a tiny 3 mm cystic area in the ventral myometrium located near a C-section scar, which appears inconsequential. Endometrium Thickness: 5 mm. Fluid within the endometrial canal (images 48, 53), but no other focal abnormality visualized. Right ovary Measurements: 3.4 x 2.5 x 2.9 centimeters = volume: 13 mL. Simple appearing 2.4 centimeter cyst with no vascular elements. Several smaller cysts or follicles. Left ovary Measurements: 2.0 x 1.4 x 1.5 centimeters = volume: 2 mL. Normal, several tiny cysts or follicles. Pulsed Doppler evaluation of both ovaries demonstrates normal low-resistance arterial and venous waveforms. Other findings Trace if any pelvic free fluid. IMPRESSION: Fluid within the endometrial canal,  presumably physiologic. No evidence of ovarian torsion and otherwise normal/physiologic appearance of the uterus and ovaries. Electronically Signed   By: Odessa Fleming M.D.   On: 11/27/2018 18:45    Procedures Procedures (including critical care time)  Medications Ordered in ED Medications  iopamidol (ISOVUE-300) 61 % injection (has no administration in time range)  sodium chloride (PF) 0.9 % injection (has no administration in time range)  sodium chloride flush (NS) 0.9 % injection 3 mL (3 mLs Intravenous Given 11/27/18 1348)  sodium chloride 0.9 % bolus 1,000 mL (0 mLs Intravenous Stopped 11/27/18 1455)  ondansetron (ZOFRAN) injection 4 mg (4 mg Intravenous Given 11/27/18 1349)  morphine 4 MG/ML injection 4 mg (4 mg Intravenous Given 11/27/18 1349)  iopamidol (ISOVUE-300)  61 % injection 100 mL (100 mLs Intravenous Contrast Given 11/27/18 1445)     Initial Impression / Assessment and Plan / ED Course  I have reviewed the triage vital signs and the nursing notes.  Pertinent labs & imaging results that were available during my care of the patient were reviewed by me and considered in my medical decision making (see chart for details).  Patient presenting with acute onset of right lower quadrant abdominal pain with nausea and vomiting that began 2 AM this morning.  History of nephrolithiasis as well as ovarian cyst, however states feels different.  Abdominal exam with right-sided tenderness, worse in the right lower quadrant.  No guarding or rebound.  Patient is slightly tachycardic at 102.  CBC with leukocytosis of 15.7.  CMP is unremarkable.  Lipase within normal limits.  Pendng urine preg.  Given patient's acute onset of symptoms, location, leukocytosis, concern for acute appendicitis versus nephrolithiasis.  CT scan ordered.  IV fluids, antiemetics and pain medication.  Will reassess after imaging.  Clinical Course as of Nov 27 1912  Thu Nov 27, 2018  1543 Pt re-evaluated, reports improvement in  symptoms.  Discussed negative CT scan results.  Given patient's level of pain on arrival, will proceed with pelvic exam and ultrasound   [JR]  1903 Patient discussed with Dr. Jeraldine LootsLockwood.  Discussed results with patient plan for discharge.  Strongly recommend she follow-up with her gynecologist for STD testing as she declined this today.  We will hold on treatment for PID at this time, currently taking cefdinir for sinusitis.  No CMT on exam.  Patient agreeable to plan and safe for discharge.   [JR]    Clinical Course User Index [JR] Malikah Principato, [place] N, PA-C   CT negative.  Tolerating PO fluids. Pelvic exam performed with mild right adnexal tenderness.  Small amount of normal-appearing discharge.  Wet prep with many white cells.  Patient declined STD testing, however it was recommended.  Pelvic ultrasound is normal.  Patient discussed with Dr. Jeraldine LootsLockwood.  Will discharge with symptomatic management and instruction to follow-up closely with PCP/gynecologist.  Discussed strict return precautions.  Patient agreeable to plan and safe for discharge.  Discussed results, findings, treatment and follow up. Patient advised of return precautions. Patient verbalized understanding and agreed with plan.  Final Clinical Impressions(s) / ED Diagnoses   Final diagnoses:  Right lower quadrant abdominal pain  Non-intractable vomiting with nausea, unspecified vomiting type    ED Discharge Orders         Ordered    ondansetron (ZOFRAN ODT) 4 MG disintegrating tablet  Every 8 hours PRN     11/27/18 1913           Zaliah Wissner, [place] N, PA-C 11/27/18 1914    Gerhard MunchLockwood, Robert, MD 11/27/18 [phone]

## 2019-01-28 ENCOUNTER — Telehealth: Payer: Self-pay

## 2019-01-28 NOTE — Telephone Encounter (Signed)
Attempted to contact pt to change appointment to virtual visit. Left message to call back. 

## 2019-02-02 ENCOUNTER — Encounter: Payer: Self-pay | Admitting: Internal Medicine

## 2019-02-02 ENCOUNTER — Telehealth (INDEPENDENT_AMBULATORY_CARE_PROVIDER_SITE_OTHER): Payer: BLUE CROSS/BLUE SHIELD | Admitting: Internal Medicine

## 2019-02-02 VITALS — BP 124/79 | HR 94 | Ht 62.0 in | Wt 150.1 lb

## 2019-02-02 DIAGNOSIS — Z7189 Other specified counseling: Secondary | ICD-10-CM

## 2019-02-02 DIAGNOSIS — R42 Dizziness and giddiness: Secondary | ICD-10-CM | POA: Diagnosis not present

## 2019-02-02 DIAGNOSIS — R55 Syncope and collapse: Secondary | ICD-10-CM | POA: Diagnosis not present

## 2019-02-02 NOTE — Patient Instructions (Signed)
Medication Instructions:  Your physician recommends that you continue on your current medications as directed. Please refer to the Current Medication list given to you today.  If you need a refill on your cardiac medications before your next appointment, please call your pharmacy.    Follow-Up: At [facility], you and your health needs are our priority.  As part of our continuing mission to provide you with exceptional heart care, we have created designated Provider Care Teams.  These Care Teams include your primary Cardiologist (physician) and Advanced Practice Providers (APPs -  Physician Assistants and Nurse Practitioners) who all work together to provide you with the care you need, when you need it. . Follow up with Dr. Rennis Golden as needed.  Any Other Special Instructions Will Be Listed Below (If Applicable). None

## 2019-02-02 NOTE — Telephone Encounter (Signed)
Per Dr. Rennis Golden, ok to add pt in for video visit today at 12:00 pm. Spoke to pt and reviewed medications. Pt stated she will go to pharmacy before her appt to check her BP, but does have an apple watch and weight scale at home. She has a smart phone and is agreeable to video visit. Sent consent information for her to review via MyChart message.

## 2019-02-02 NOTE — Telephone Encounter (Signed)
Will route to nurse working with Dr. Rennis Golden today.

## 2019-02-02 NOTE — Telephone Encounter (Signed)
Ok thanks .Marland Kitchen we'll aim for a 12:00 visit.  Dr. Rexene Edison

## 2019-02-02 NOTE — Progress Notes (Signed)
Virtual Visit via Video Note   This visit type was conducted due to national recommendations for restrictions regarding the COVID-19 Pandemic (e.g. social distancing) in an effort to limit this patient's exposure and mitigate transmission in our community.  Due to her co-morbid illnesses, this patient is at least at moderate risk for complications without adequate follow up.  This format is felt to be most appropriate for this patient at this time.  All issues noted in this document were discussed and addressed.  A limited physical exam was performed with this format.  Please refer to the patient's chart for her consent to telehealth for [facility].   Evaluation Performed:  Video visit - new patient for presyncope  Date:  02/02/2019   ID:  Catherine Bryan, DOB 1976/03/11, MRN [id number]  Patient Location:  [address] [place] [address]  Provider location:   [address] [place], [place] [address]  PCP:  Darrow Bussing, MD  Cardiologist:  No primary care provider on file. Electrophysiologist:  None   Chief Complaint:  Dizziness, pre-syncope  History of Present Illness:    Catherine Bryan is a 43 y.o. female who presents via audio/video conferencing for a telehealth visit today.  This is a pleasant 43 year old female who is kindly referred by Dr. Docia Chuck with a history of kidney stones, migraine headaches, asthma/allergies, and recurrent syncopal episodes.  She was referred for dizziness and episodes of syncope and presyncope.  She is followed by neurologist for migraine headaches and is being treated with combination of pills and injections.  She reports that she has had episodes of syncope since her teenage years.  Her first episode she passed out in class when she was 18 and "busted some front teeth".  She has had several episodes over the years since then, all very similar in their scope.  They typically are preceded with a short prodrome, feeling of hot and being  flushed.  She says that sometimes she has to take off her shirt and lay down on a cold floor.  She passed out once after going out with some friends.  She rarely uses alcohol but was out with her husband and they had a drink or 2 and she then had a syncopal episode.  Her friend thought she was not breathing and even try to give her mouth-to-mouth, apparently but she woke up and she felt fine.  Additionally, she describes a near daily dizziness recently.  She is struggled with sinus congestion and sinusitis which is recurrent, being treated for both bacterial and possibly allergic sinusitis.  She has seen ENT, who is been evaluating her and felt that she might have migraine related dizziness or syncope.  She denies any chest pain, significant shortness of breath beyond her asthma, or intermittent palpitations.  The patient does not have symptoms concerning for COVID-19 infection (fever, chills, cough, or new SHORTNESS OF BREATH).    Prior CV studies:   The following studies were reviewed today:  Chart history Labwork  PMHx:  Past Medical History:  Diagnosis Date   Kidney stones    Ovarian cyst     Past Surgical History:  Procedure Laterality Date   CESAREAN SECTION  1994   LITHOTRIPSY     SINUS SURGERY WITH INSTATRAK  2006   WISDOM TOOTH EXTRACTION      FAMHx:  Family History  Problem Relation Age of Onset   Hypertension Mother    Anemia Mother    Hypertension Father  Bipolar disorder Sister    Hypertension Sister    Heart disease Sister    Lung cancer Maternal Grandmother    Hypertension Maternal Grandfather    Stroke Maternal Grandfather    Heart attack Paternal Grandmother     SOCHx:   reports that she has never smoked. She has never used smokeless tobacco. She reports that she does not drink alcohol or use drugs.  ALLERGIES:  No Known Allergies  MEDS:  No outpatient medications have been marked as taking for the 02/02/19 encounter (Telemedicine)  with Chrystie Nose, MD.     ROS: Pertinent items noted in HPI and remainder of comprehensive ROS otherwise negative.  Labs/Other Tests and Data Reviewed:    Recent Labs: 11/27/2018: ALT 19; BUN 16; Creatinine, Ser 0.68; Hemoglobin 15.0; Platelets 223; Potassium 4.1; Sodium 138   Recent Lipid Panel No results found for: CHOL, TRIG, HDL, CHOLHDL, LDLCALC, LDLDIRECT  Wt Readings from Last 3 Encounters:  02/02/19 150 lb 1.6 oz (68.1 kg)     Exam:    Vital Signs:  BP 124/79    Pulse 94    Ht  (1.575 m)    Wt 150 lb 1.6 oz (68.1 kg)    BMI 27.45 kg/m    General appearance: alert and no distress Lungs: no visible respiratory difficulty Extremities: extremities normal, atraumatic, no cyanosis or edema Skin: Skin color, texture, turgor normal. No rashes or lesions Neurologic: Mental status: Alert, oriented, thought content appropriate Psych: Plesant  ASSESSMENT & PLAN:    1. Vasovagal syncope 2. Dizziness  Ms. 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  I talked to her about things to try to avoid to reduce her risk of passing out, including medications such as diuretics or the tamsulosin which was stopped by her PCP, dehydration, painful or frightening autonomic stimuli, blood donation, injections or needles which all may precipitate vasovagal events.  She may get some relief if she has to sit or stand for long periods of time by using bilateral compression stockings.  She should remain well-hydrated.  Because of her family history of hypertension, I have not recommended liberal salt addition to her diet.  She is not a fan of salt either way.  With respect to her dizziness, I suspect this is more likely related to chronic sinusitis.  There is no clear cardiac explanation for a daily dizziness or imbalance such as an arrhythmia, carotid disease (unlikely in a young person and her episodes of not associated with quick head movements),  or significant cardiomyopathy (no signs or symptoms of congestive heart failure).  I do not believe further cardiac work-up is warranted for her syncopal episodes.  Should she have episodes of palpitations or syncope without prodrome, different than her normal pattern, I would be happy to see her back in consultation.  Thanks again for the kind referral. Follow-up with me as needed.  COVID-19 Education: The signs and symptoms of COVID-19 were discussed with the patient and how to seek care for testing (follow up with PCP or arrange E-visit).  The importance of social distancing was discussed today.  Patient Risk:   After full review of this patients clinical status, I feel that they are at least moderate risk at this time.  Time:   Today, I have spent 25 minutes with the patient with telehealth technology discussing syncope, dizziness, recurrent sinusitis, migraine headaches, family history of hypertension.     Medication Adjustments/Labs and  Tests Ordered: Current medicines are reviewed at length with the patient today.  Concerns regarding medicines are outlined above.   Tests Ordered: No orders of the defined types were placed in this encounter.   Medication Changes: No orders of the defined types were placed in this encounter.   Disposition:  prn  Chrystie NoseKenneth C. Adelyn Roscher, MD, Milagros LollFACC, FACP  [place]   [facility]  Medical Director of the [facility]  [facility] Diplomate of the American Board of Clinical Lipidology Attending Cardiologist  Direct Dial: [phone]   Fax: [phone]  Website:  [email or web address]  Chrystie NoseKenneth C Harumi Yamin, MD  02/02/2019 12:04 PM

## 2019-02-02 NOTE — Telephone Encounter (Signed)
Follow Up:    Pt called this morning. She is down for Thursday as a new pt. She wants to know if she can have her appt today, because she is off today.nsid

## 2019-02-05 ENCOUNTER — Ambulatory Visit: Payer: BLUE CROSS/BLUE SHIELD | Admitting: Internal Medicine

## 2019-02-06 DIAGNOSIS — H81392 Other peripheral vertigo, left ear: Secondary | ICD-10-CM | POA: Insufficient documentation

## 2019-12-01 ENCOUNTER — Other Ambulatory Visit: Payer: Self-pay

## 2019-12-01 ENCOUNTER — Ambulatory Visit: Payer: 59 | Admitting: Podiatry

## 2019-12-01 ENCOUNTER — Encounter: Payer: Self-pay | Admitting: Podiatry

## 2019-12-01 ENCOUNTER — Ambulatory Visit (INDEPENDENT_AMBULATORY_CARE_PROVIDER_SITE_OTHER): Payer: 59

## 2019-12-01 VITALS — BP 115/79 | HR 75 | Resp 16

## 2019-12-01 DIAGNOSIS — M722 Plantar fascial fibromatosis: Secondary | ICD-10-CM | POA: Diagnosis not present

## 2019-12-01 MED ORDER — METHYLPREDNISOLONE 4 MG PO TBPK: ORAL_TABLET | ORAL | 0 refills | Status: DC

## 2019-12-01 MED ORDER — MELOXICAM 15 MG PO TABS: 15.0000 mg | ORAL_TABLET | Freq: Every day | ORAL | 3 refills | Status: DC

## 2019-12-01 NOTE — Progress Notes (Signed)
Subjective:  Patient ID: Catherine Bryan, female    DOB: [birth year],  MRN: [id number] HPI Chief Complaint  Patient presents with  . Foot Pain    Plantar heel bilateral (L>R) - aching x few months, AM pain, saw Ortho-recommended night splint, ice, massage, compressive sock, did wear walking boot x 2 weeks, also has back issues - causes pain to come down the left leg, took a round of prednisone, now on Relafen PRN  . New Patient (Initial Visit)    44 y.o. female presents with the above complaint.   ROS: Denies fever chills nausea vomiting muscle aches pains calf pain back pain chest pain shortness of breath.  Past Medical History:  Diagnosis Date  . Kidney stones   . Ovarian cyst    Past Surgical History:  Procedure Laterality Date  . CESAREAN SECTION  1994  . LITHOTRIPSY    . SINUS SURGERY WITH INSTATRAK  2006  . WISDOM TOOTH EXTRACTION      Current Outpatient Medications:  .  zonisamide (ZONEGRAN) 50 MG capsule, Take 50 mg by mouth daily., Disp: , Rfl:  .  albuterol (PROVENTIL HFA;VENTOLIN HFA) 108 (90 Base) MCG/ACT inhaler, Inhale 2 puffs into the lungs every 4 (four) hours as needed for wheezing or shortness of breath., Disp: , Rfl:  .  azelastine (ASTELIN) 0.1 % nasal spray, Place 1 spray into both nostrils 2 (two) times daily. Use in each nostril as directed, Disp: , Rfl:  .  azelastine (OPTIVAR) 0.05 % ophthalmic solution, Place 1 drop into both eyes 2 (two) times daily., Disp: , Rfl:  .  baclofen (LIORESAL) 10 MG tablet, Take 10 mg by mouth 2 (two) times daily as needed., Disp: , Rfl:  .  clonazePAM (KLONOPIN) 0.5 MG tablet, Take 0.5 mg by mouth 2 (two) times daily as needed., Disp: , Rfl:  .  famotidine (PEPCID) 40 MG tablet, Take 40 mg by mouth daily., Disp: , Rfl:  .  fluocinonide cream (LIDEX) 0.05 %, Apply 1 application topically 2 (two) times daily as needed. Eczema., Disp: , Rfl:  .  fluticasone (CUTIVATE) 0.05 % cream, Apply 1 application topically 2 (two) times daily  as needed. Eczema., Disp: , Rfl:  .  fluticasone (FLONASE) 50 MCG/ACT nasal spray, Place 1 spray into both nostrils 2 (two) times daily., Disp: , Rfl:  .  levocetirizine (XYZAL) 5 MG tablet, Take 5 mg by mouth every evening., Disp: , Rfl:  .  levonorgestrel-ethinyl estradiol (SEASONALE,INTROVALE,JOLESSA) 0.15-0.03 MG tablet, Take 1 tablet by mouth daily., Disp: , Rfl:  .  meloxicam (MOBIC) 15 MG tablet, Take 1 tablet (15 mg total) by mouth daily., Disp: 30 tablet, Rfl: 3 .  methylPREDNISolone (MEDROL DOSEPAK) 4 MG TBPK tablet, 6 day dose pack - take as directed, Disp: 21 tablet, Rfl: 0 .  montelukast (SINGULAIR) 10 MG tablet, Take 10 mg by mouth at bedtime., Disp: , Rfl:  .  nabumetone (RELAFEN) 500 MG tablet, Take 500 mg by mouth 2 (two) times daily as needed., Disp: , Rfl:  .  Vitamin D, Ergocalciferol, (DRISDOL) 1.25 MG (50000 UNIT) CAPS capsule, Take 50,000 Units by mouth once a week., Disp: , Rfl:  .  zonisamide (ZONEGRAN) 100 MG capsule, Take 100 mg by mouth at bedtime., Disp: , Rfl:   Allergies  Allergen Reactions  . Amoxicillin-Pot Clavulanate Nausea And Vomiting  . Cefdinir Diarrhea and Nausea And Vomiting    N/V N/V    Review of Systems Objective:   Vitals:   12/01/19  0856  BP: 115/79  Pulse: 75  Resp: 16    General: Well developed, nourished, in no acute distress, alert and oriented x3   Dermatological: Skin is warm, dry and supple bilateral. Nails x 10 are well maintained; remaining integument appears unremarkable at this time. There are no open sores, no preulcerative lesions, no rash or signs of infection present.  Vascular: Dorsalis Pedis artery and Posterior Tibial artery pedal pulses are 2/4 bilateral with immedate capillary fill time. Pedal hair growth present. No varicosities and no lower extremity edema present bilateral.   Neruologic: Grossly intact via light touch bilateral. Vibratory intact via tuning fork bilateral. Protective threshold with Semmes Wienstein  monofilament intact to all pedal sites bilateral. Patellar and Achilles deep tendon reflexes 2+ bilateral. No Babinski or clonus noted bilateral.   Musculoskeletal: No gross boney pedal deformities bilateral. No pain, crepitus, or limitation noted with foot and ankle range of motion bilateral. Muscular strength 5/5 in all groups tested bilateral.  She has pain on palpation medial calcaneal tubercles bilaterally.  Some tenderness on palpation of the fourth fifth TMT J left foot greater than right.  Gait: Unassisted, Nonantalgic.    Radiographs:  Radiographs taken today do not demonstrate any significant pathology retrocalcaneal heel spurs with thickening of the Achilles are noted.  There are some soft tissue swelling of the plantar fascia at the calcaneal insertion site but other than that no acute findings.  Osseously mature individual with mild pes planus.  Assessment & Plan:   Assessment: Plantar fasciitis mild pes planus bilateral.  Left greater than right.  Plan: Discussed etiology pathology conservative versus surgical therapies.  At this point I injected the bilateral heels 20 mg Kenalog 5 mg Marcaine after sterile Betadine skin prep.  Tolerated procedure well without complications.  I also started her on a Medrol Dosepak to be followed by meloxicam.  Placed her in bilateral plantar fascial braces.  She has plantar fascial night splints at home.  We discussed appropriate shoe gear stretching exercises ice therapy and shoe gear modifications.  I will follow-up with her in 1 month.     Ellamarie Naeve T. [place], [place]

## 2019-12-01 NOTE — Patient Instructions (Signed)

## 2019-12-29 ENCOUNTER — Encounter: Payer: Self-pay | Admitting: Podiatry

## 2019-12-29 ENCOUNTER — Other Ambulatory Visit: Payer: Self-pay

## 2019-12-29 ENCOUNTER — Ambulatory Visit: Payer: 59 | Admitting: Podiatry

## 2019-12-29 VITALS — Temp 97.7°F

## 2019-12-29 DIAGNOSIS — M722 Plantar fascial fibromatosis: Secondary | ICD-10-CM

## 2019-12-29 MED ORDER — DICLOFENAC SODIUM 75 MG PO TBEC: 75.0000 mg | DELAYED_RELEASE_TABLET | Freq: Two times a day (BID) | ORAL | 3 refills | Status: DC

## 2019-12-30 NOTE — Progress Notes (Signed)
She presents today for follow-up of her plantar fasciitis.  She states that it was good for about a week and went right back to where it was.  Objective: Vital signs are stable she is alert and oriented x3 still has pain on palpation medial calcaneal tubercles bilateral.  Assessment: Intractable plantar fasciitis bilateral.  Plan: Continue all concurrent therapies plantar fascial brace night splint shoe gear etc. I will change her meloxicam to diclofenac 75 mg 1 tablet twice daily #60 with 3 refills.  She will follow-up with [facility] or [facility] for set of orthotics to be made.

## 2020-01-18 ENCOUNTER — Ambulatory Visit: Payer: 59 | Admitting: Orthotics

## 2020-01-18 ENCOUNTER — Other Ambulatory Visit: Payer: Self-pay

## 2020-01-18 DIAGNOSIS — M722 Plantar fascial fibromatosis: Secondary | ICD-10-CM | POA: Diagnosis not present

## 2020-01-18 DIAGNOSIS — H81392 Other peripheral vertigo, left ear: Secondary | ICD-10-CM

## 2020-01-18 NOTE — Progress Notes (Signed)

## 2020-02-04 ENCOUNTER — Other Ambulatory Visit: Payer: Self-pay

## 2020-02-04 ENCOUNTER — Ambulatory Visit: Payer: 59 | Admitting: Podiatry

## 2020-02-04 DIAGNOSIS — M722 Plantar fascial fibromatosis: Secondary | ICD-10-CM

## 2020-02-04 NOTE — Progress Notes (Signed)
She presents today for follow-up of her bilateral plantar fasciitis she states that there has been vast improvement though she only rates it at 25% she states that she can be on her feet all day without constant pain.  She states the plantar fascial braces really help and it seems that the diclofenac is helping as well.  Her neurologist however is concerned about the diclofenac and rebound migraines since she is a migraine patient.  She is only taking it once a day.  She has recently been and to be casted for orthotics with [facility] March 29 was that date.  Objective: Vital signs are stable alert and oriented x3 he has very little in the way of pain on palpation medial calcaneal tubercles bilateral pulses remain palpable there is no erythema edema cellulitis drainage or odor.  Assessment: Slowly resolving plantar fasciitis bilateral left greater than right.  Plan: At this point we will continue current therapies and wait on these orthotics to come in and I will follow-up with her about 1 month after the orthotics come in.

## 2020-02-08 ENCOUNTER — Other Ambulatory Visit: Payer: Self-pay

## 2020-02-08 ENCOUNTER — Ambulatory Visit: Payer: 59 | Admitting: Orthotics

## 2020-02-08 DIAGNOSIS — M722 Plantar fascial fibromatosis: Secondary | ICD-10-CM

## 2020-02-08 NOTE — Progress Notes (Signed)
Patient came in today to pick up custom made foot orthotics.  The goals were accomplished and the patient reported no dissatisfaction with said orthotics.  Patient was advised of breakin period and how to report any issues. 

## 2021-08-16 ENCOUNTER — Other Ambulatory Visit: Payer: Self-pay

## 2021-08-16 ENCOUNTER — Encounter (HOSPITAL_BASED_OUTPATIENT_CLINIC_OR_DEPARTMENT_OTHER): Payer: Self-pay | Admitting: Obstetrics and Gynecology

## 2021-08-16 ENCOUNTER — Emergency Department (HOSPITAL_BASED_OUTPATIENT_CLINIC_OR_DEPARTMENT_OTHER)
Admission: EM | Admit: 2021-08-16 | Discharge: 2021-08-16 | Disposition: A | Discharge status: Home / Self Care | Payer: 59 | Attending: Emergency Medicine | Admitting: Emergency Medicine

## 2021-08-16 DIAGNOSIS — R059 Cough, unspecified: Secondary | ICD-10-CM | POA: Diagnosis present

## 2021-08-16 DIAGNOSIS — J329 Chronic sinusitis, unspecified: Secondary | ICD-10-CM | POA: Insufficient documentation

## 2021-08-16 LAB — GROUP A STREP BY PCR: Group A Strep by PCR: NOT DETECTED

## 2021-08-16 MED ORDER — PREDNISONE 10 MG (21) PO TBPK: ORAL_TABLET | Freq: Every day | ORAL | 0 refills | Status: DC

## 2021-08-16 MED ORDER — ACETAMINOPHEN 325 MG PO TABS
650.0000 mg | ORAL_TABLET | Freq: Once | ORAL | Status: AC
  Administered 2021-08-16: 650 mg via ORAL
  Filled 2021-08-16: qty 2

## 2021-08-16 MED ORDER — DEXAMETHASONE SODIUM PHOSPHATE 10 MG/ML IJ SOLN
10.0000 mg | Freq: Once | INTRAMUSCULAR | Status: AC
  Administered 2021-08-16: 10 mg via INTRAMUSCULAR
  Filled 2021-08-16: qty 1

## 2021-08-16 MED ORDER — DOXYCYCLINE HYCLATE 100 MG PO CAPS
100.0000 mg | ORAL_CAPSULE | Freq: Two times a day (BID) | ORAL | 0 refills | Status: AC
Start: 2021-08-16 — End: 2021-08-23

## 2021-08-16 NOTE — Discharge Instructions (Signed)
You were given a prescription for antibiotics. Please take the antibiotic prescription fully.   Take steroids as directed   Please follow up with your primary care provider within 5-7 days for re-evaluation of your symptoms. If you do not have a primary care provider, information for a healthcare clinic has been provided for you to make arrangements for follow up care. Please return to the emergency department for any new or worsening symptoms.

## 2021-08-16 NOTE — ED Provider Notes (Signed)
[facility] EMERGENCY DEPT Provider Note   CSN: [id number] Arrival date & time: 08/16/21  1749     History Chief Complaint  Patient presents with   Cough   Sore Throat    Catherine Bryan is a 45 y.o. female.  HPI  45 year old female with a history of ovarian cyst, kidney stones, who presents to the emergency department today for evaluation of URI symptoms.  She states over the last 24 hours she has developed a sore throat.  Prior to this she had several days of nasal congestion, sinus pressure, postnasal drip.  She is had an intermittent cough.  She was noted to have a fever on arrival to the ED.  She typically gets bacterial sinusitis yearly and is given steroids and antibiotics with relief.  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     Family History  Problem Relation Age of Onset   Hypertension Mother    Anemia Mother    Hypertension Father    Bipolar disorder Sister    Hypertension Sister    Heart disease Sister    Lung cancer Maternal Grandmother    Hypertension Maternal Grandfather    Stroke Maternal  Grandfather    Heart attack Paternal Grandmother     Social History   Tobacco Use   Smoking status: Never   Smokeless tobacco: Never  Substance Use Topics   Alcohol use: Never   Drug use: Never    Home Medications Prior to Admission medications   Medication Sig Start Date End Date Taking? Authorizing Provider  doxycycline (VIBRAMYCIN) 100 MG capsule Take 1 capsule (100 mg total) by mouth 2 (two) times daily for 7 days. 08/16/21 08/23/21 Yes Shalunda Lindh S, PA-C  predniSONE (STERAPRED UNI-PAK 21 TAB) 10 MG (21) TBPK tablet Take by mouth daily. Take 6 tabs by mouth daily  for 2 days, then 5 tabs for 2 days, then 4 tabs for 2 days, then 3 tabs for 2 days, 2 tabs for 2 days, then 1 tab by mouth daily for 2 days 08/16/21  Yes Aveleen Nevers S, PA-C  albuterol (PROVENTIL HFA;VENTOLIN HFA) 108 (90 Base) MCG/ACT inhaler Inhale 2 puffs into the lungs every 4 (four) hours as needed for wheezing or shortness of breath.    [provider]  azelastine (ASTELIN) 0.1 % nasal spray Place 1 spray into both nostrils 2 (two) times daily. Use in each nostril as directed    [provider]  azelastine (OPTIVAR) 0.05 % ophthalmic solution Place 1 drop into both eyes 2 (two) times daily.    [provider]  baclofen (LIORESAL) 10 MG tablet Take 10 mg by mouth 2 (two) times daily as needed. 10/29/19   [provider]  clobetasol (TEMOVATE) 0.05 % external solution APPLY TO AFFECTED AREA UP TO TWICE A DAY AS NEEDED (NOT TO FACE, GROIN, OR AXILLA) 12/15/19   [provider]  clonazePAM (KLONOPIN) 0.5 MG tablet Take 0.5 mg by mouth 2 (two) times daily as needed. 10/08/19   [provider]  diclofenac (VOLTAREN) 75 MG EC tablet Take 1 tablet (75 mg total) by mouth 2 (two) times daily. 12/29/19   Hyatt, Max T, DPM  famotidine (PEPCID) 40 MG tablet Take 40 mg by mouth daily.    [provider]  fluocinonide cream (LIDEX) 0.05 % Apply 1 application topically 2  (two) times daily as needed. Eczema. 10/13/18   [provider]  fluticasone (CUTIVATE) 0.05 % cream Apply 1 application topically 2 (two) times daily as needed. Eczema. 10/14/18   [provider]  fluticasone (FLONASE) 50 MCG/ACT nasal spray Place 1 spray into both nostrils 2 (two) times daily.    [provider]  levocetirizine (XYZAL) 5 MG tablet Take 5 mg by mouth every evening.    [provider]  levonorgestrel-ethinyl estradiol (SEASONALE,INTROVALE,JOLESSA) 0.15-0.03 MG tablet Take 1 tablet by mouth daily. 11/18/18   [provider]  meloxicam (MOBIC) 15 MG tablet Take 1 tablet (15 mg total) by mouth daily. 12/01/19   Hyatt, Max T, DPM  methylPREDNISolone (MEDROL DOSEPAK) 4 MG TBPK tablet 6 day dose pack - take as directed 12/01/19   Hyatt, Max T, DPM  montelukast (SINGULAIR) 10 MG tablet Take 10 mg by mouth at bedtime.    [provider]  nabumetone (RELAFEN) 500 MG tablet Take 500 mg by mouth 2 (two) times daily as needed. 11/05/19   [provider]  rizatriptan (MAXALT) 10 MG tablet Take 10 mg by mouth 2 (two) times daily as needed. 12/03/19   [provider]  Vitamin D, Ergocalciferol, (DRISDOL) 1.25 MG (50000 UNIT) CAPS capsule Take 50,000 Units by mouth once a week. 11/06/19   [provider]  zonisamide (ZONEGRAN) 100 MG capsule Take 100 mg by mouth at bedtime. 10/29/19   [provider]  zonisamide (ZONEGRAN) 50 MG capsule Take 50 mg by mouth daily.    [provider]    Allergies    Amoxicillin-pot clavulanate and Cefdinir  Review of Systems   Review of Systems  Constitutional:  Negative for fever.  HENT:  Positive for congestion, postnasal drip, rhinorrhea, sinus pressure, sinus pain and sore throat.   Eyes:  Negative for visual disturbance.  Respiratory:  Positive for cough. Negative for shortness of breath.   Cardiovascular:  Negative for chest pain.  Gastrointestinal:  Negative for  abdominal pain.  Genitourinary:  Negative for pelvic pain.  Musculoskeletal:  Negative for back pain.   Physical Exam Updated Vital Signs BP 138/90 (BP Location: Right Arm)   Pulse 99   Temp (!) 100.6 F (38.1 C)   Resp 16   SpO2 100%   Physical Exam Vitals and nursing note reviewed.  Constitutional:      General: She is not in acute distress.    Appearance: She is well-developed.  HENT:     Head: Normocephalic and atraumatic.     Right Ear: Ear canal normal. A middle ear effusion is present. Tympanic membrane is not erythematous.     Left Ear: Ear canal normal. A middle ear effusion is present.  Tympanic membrane is not erythematous.     Mouth/Throat:     Mouth: Mucous membranes are moist.     Pharynx: Uvula midline. Posterior oropharyngeal erythema present. No oropharyngeal exudate.     Tonsils: No tonsillar exudate or tonsillar abscesses. 0 on the right. 0 on the left.  Eyes:     Conjunctiva/sclera: Conjunctivae normal.  Cardiovascular:     Rate and Rhythm: Normal rate and regular rhythm.  Pulmonary:     Effort: Pulmonary effort is normal.     Breath sounds: Normal breath sounds.  Musculoskeletal:        General: Normal range of motion.     Cervical back: Neck supple.  Skin:    General: Skin is warm and dry.  Neurological:     Mental Status: She is alert.    ED Results / Procedures / Treatments   Labs (all labs ordered are listed, but only abnormal results are displayed) Labs Reviewed  GROUP A STREP BY PCR  RESP PANEL BY RT-PCR (FLU A&B, COVID) ARPGX2    EKG None  Radiology No results found.  Procedures Procedures   Medications Ordered in ED Medications  dexamethasone (DECADRON) injection 10 mg (has no administration in time range)  acetaminophen (TYLENOL) tablet 650 mg (has no administration in time range)    ED Course  I have reviewed the triage vital signs and the nursing notes.  Pertinent labs & imaging results that were available during my  care of the patient were reviewed by me and considered in my medical decision making (see chart for details).    MDM Rules/Calculators/A&P                          Patient complaining of symptoms of sinusitis.    Severe symptoms are present with purulent nasal discharge and maxillary sinus pain.  Concern for acute bacterial rhinosinusitis.  She declines covid tests as she had two at home that were negative. Patient discharged with doxycycline and steroids. Instructions given for steroid  nasal wash and recommendations for follow-up with primary care physician.     Final Clinical Impression(s) / ED Diagnoses Final diagnoses:  Sinusitis, unspecified chronicity, unspecified location    Rx / DC Orders ED Discharge Orders          Ordered    doxycycline (VIBRAMYCIN) 100 MG capsule  2 times daily        08/16/21 2150    predniSONE (STERAPRED UNI-PAK 21 TAB) 10 MG (21) TBPK tablet  Daily        08/16/21 2150             Karrie Meres, PA-C 08/16/21 2151    Gloris Manchester, MD 08/17/21 [phone]

## 2021-08-16 NOTE — ED Notes (Signed)
Patient refusing Resp. Panel at this Time.

## 2021-08-16 NOTE — ED Triage Notes (Signed)
Patient reports to the ER for sore throat and cough. Patient reports negative homes tyest for COVID. Endorses ear pain

## 2021-08-18 ENCOUNTER — Other Ambulatory Visit: Payer: Self-pay | Admitting: Specialist

## 2021-08-18 DIAGNOSIS — G43709 Chronic migraine without aura, not intractable, without status migrainosus: Secondary | ICD-10-CM

## 2021-09-03 ENCOUNTER — Ambulatory Visit
Admission: RE | Admit: 2021-09-03 | Discharge: 2021-09-03 | Disposition: A | Discharge status: Home / Self Care | Payer: BLUE CROSS/BLUE SHIELD | Source: Ambulatory Visit | Attending: Specialist | Admitting: Specialist

## 2021-09-03 ENCOUNTER — Other Ambulatory Visit: Payer: Self-pay

## 2021-09-03 DIAGNOSIS — G43709 Chronic migraine without aura, not intractable, without status migrainosus: Secondary | ICD-10-CM

## 2021-09-03 IMAGING — MR MR HEAD W/O CM
8 series · 48 of 48 positions shown · non-contrast
Comparison: None.

CLINICAL DATA: Chronic migraine without ARAH.  Rule out tumor

EXAM:
MRI HEAD WITHOUT CONTRAST
TECHNIQUE: Multiplanar, multiecho pulse sequences of the brain and surrounding
structures were obtained without intravenous contrast.

[Series 5: T1 · sagittal · 4.0mm · 0.75mm/px · 3 of 31 slices shown]
[im 1/31]
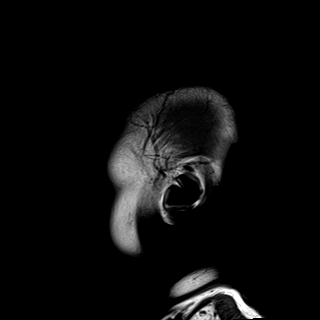
[im 16/31]
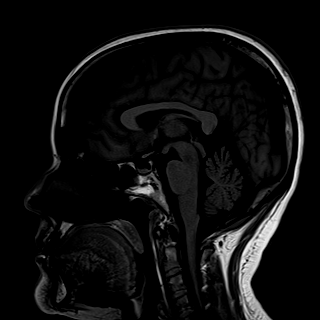
[im 31/31]
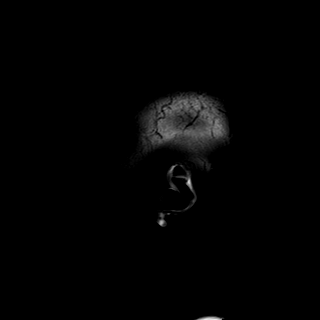

[Series 6: DWI · axial · 3.0mm · 0.94mm/px · z∈[-115,+32]mm · 17 of 168 slices shown (1 of 3)]
[im 1/168]
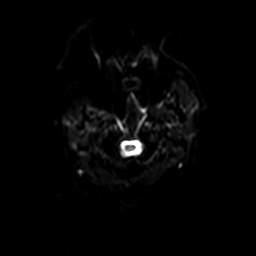
[im 11/168]
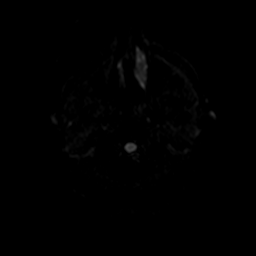
[im 21/168]
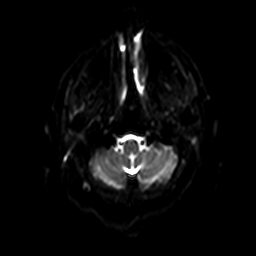
[im 32/168]
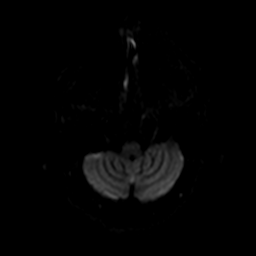
[im 42/168]
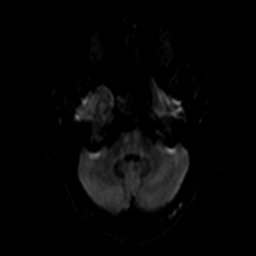
[im 53/168]
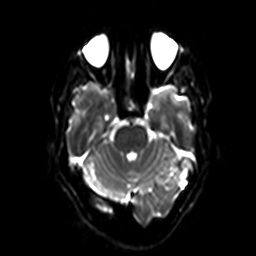
[im 63/168]
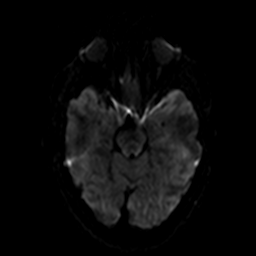
[im 74/168]
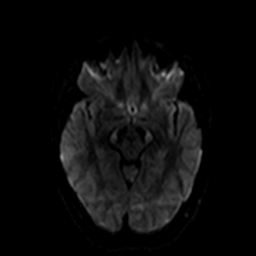
[im 84/168]
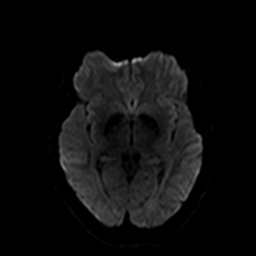
[im 94/168]
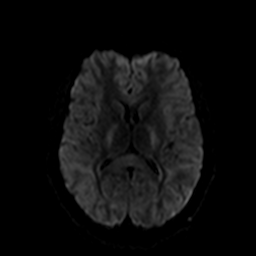
[im 105/168]
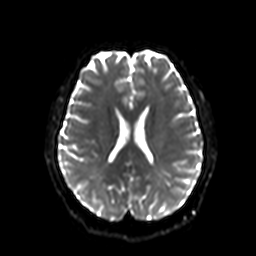
[im 115/168]
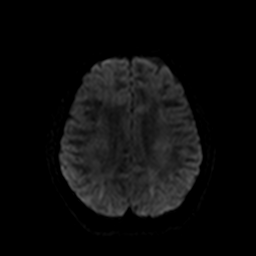
[im 126/168]
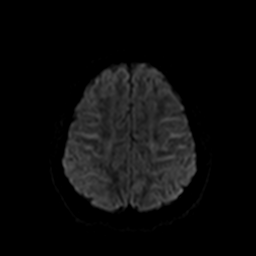
[im 136/168]
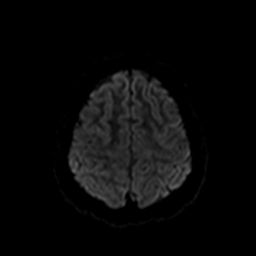
[im 147/168]
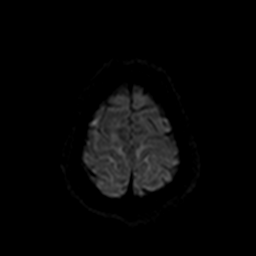
[im 157/168]
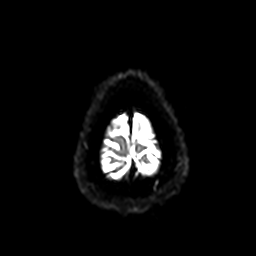
[im 168/168]
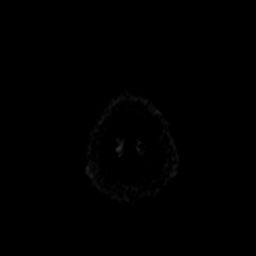

[Series 7: ax dwi_tracew · axial · 3.0mm · 0.94mm/px · z∈[-115,+32]mm · 8 of 84 slices shown]
[im 1/84]
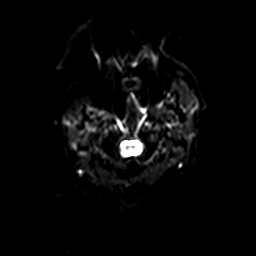
[im 12/84]
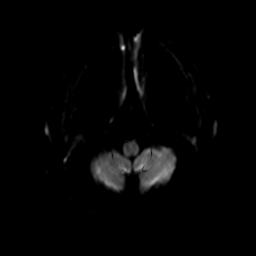
[im 24/84]
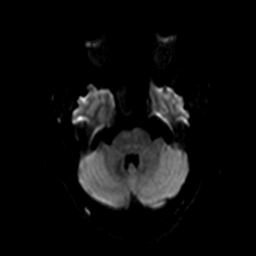
[im 36/84]
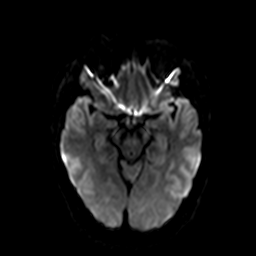
[im 48/84]
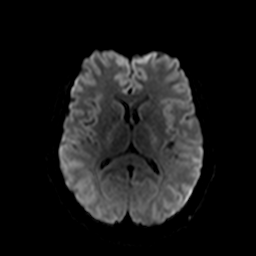
[im 60/84]
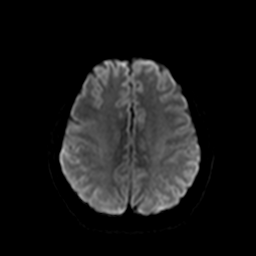
[im 72/84]
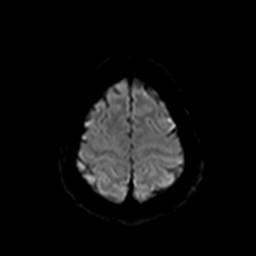
[im 84/84]
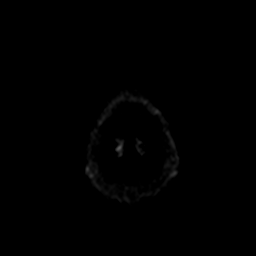

[Series 8: ax dwi_adc · axial · 3.0mm · 0.94mm/px · z∈[-115,+32]mm · 4 of 42 slices shown]
[im 1/42]
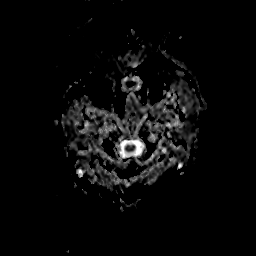
[im 14/42]
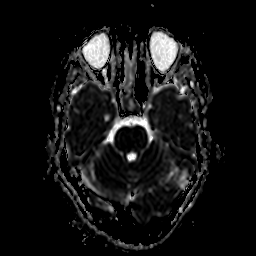
[im 28/42]
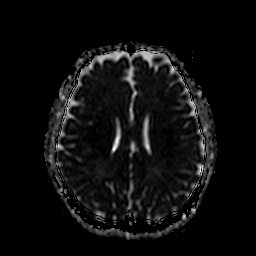
[im 42/42]
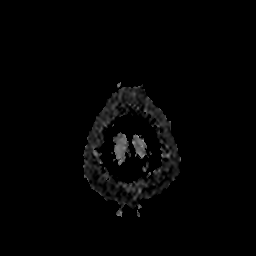

[Series 9: DWI · coronal · 5.0mm · 1.44mm/px · 7 of 66 slices shown (2 of 3)]
[im 1/66]
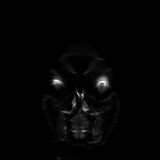
[im 11/66]
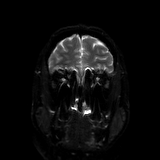
[im 22/66]
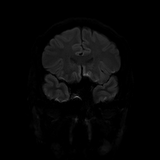
[im 33/66]
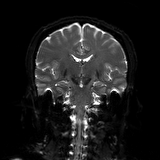
[im 44/66]
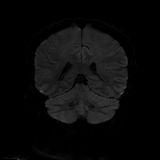
[im 55/66]
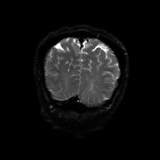
[im 66/66]
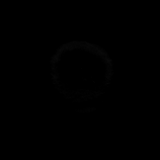

[Series 10: DWI · coronal · 5.0mm · 1.44mm/px · 3 of 33 slices shown (3 of 3)]
[im 1/33]
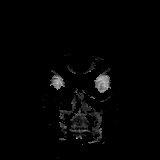
[im 17/33]
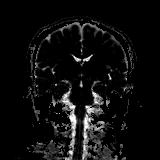
[im 33/33]
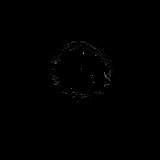

[Series 11: T2 · axial · 4.0mm · 0.36mm/px · z∈[-126,+35]mm · 3 of 32 slices shown]
[im 1/32]
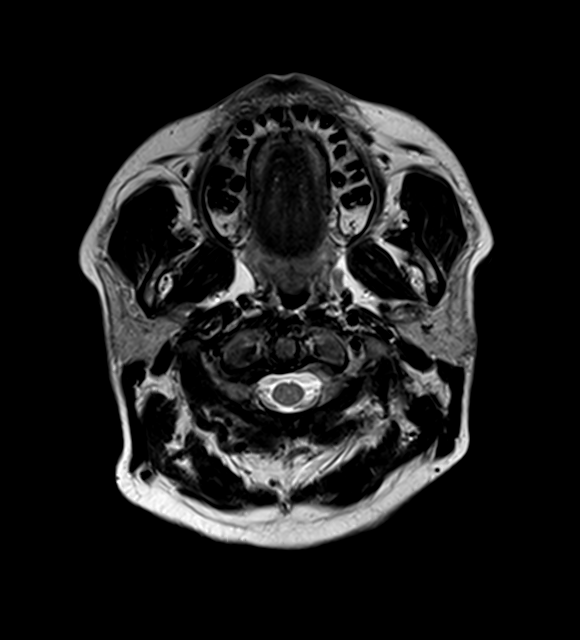
[im 16/32]
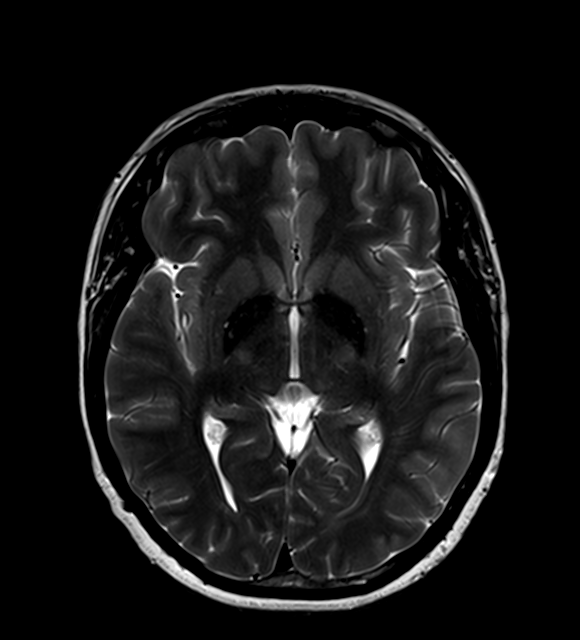
[im 32/32]
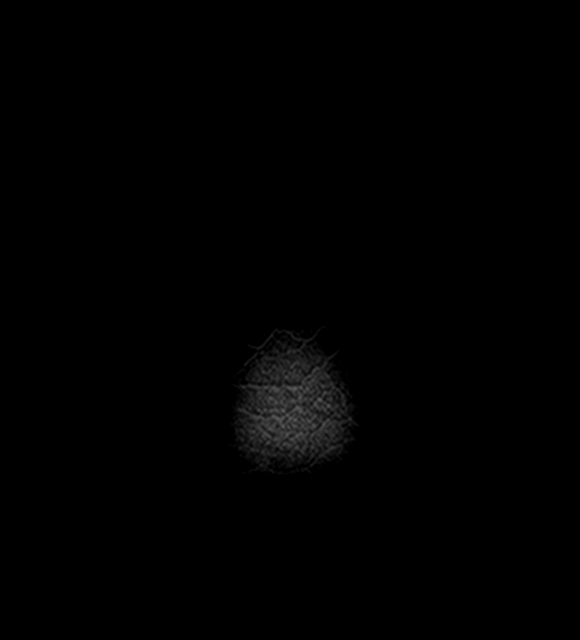

[Series 12: FLAIR · axial · 3.0mm · 0.72mm/px · z∈[-124,+31]mm · 3 of 27 slices shown]
[im 1/27]
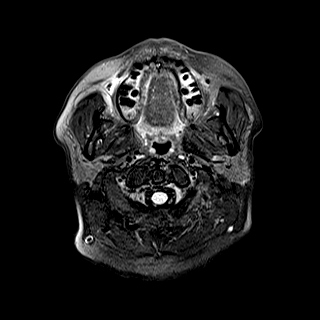
[im 14/27]
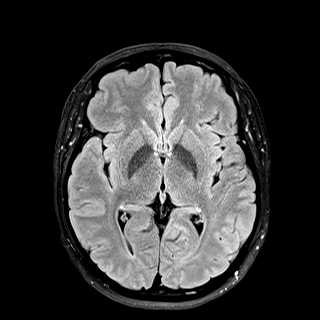
[im 27/27]
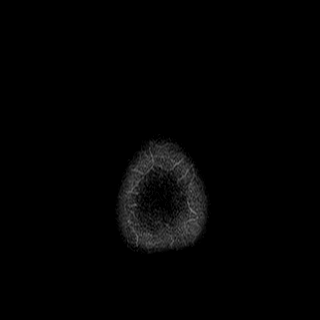

[48 of 48 positions shown; findings below may reference images not displayed]

FINDINGS: Brain: No acute infarction, hemorrhage, hydrocephalus, extra-axial
collection or mass lesion.

Vascular: Normal flow voids.

Skull and upper cervical spine: Normal marrow signal.

Sinuses/Orbits: T1 hyper and T2 hypointense ovoid structure in the
right sphenoid sinus with peripheral T2 hyperintense mucosal
thickening, most consistent with retention cyst.
IMPRESSION: 1. Negative MRI of the brain.
2. Opacified right sphenoid sinus with features of retention cyst
and mucosal thickening.

## 2021-09-03 IMAGING — MR MR HEAD W/O CM
4 series · 48 of 48 positions shown · non-contrast
Comparison: None.

CLINICAL DATA: Chronic migraine without ARAH.  Rule out tumor

EXAM:
MRI HEAD WITHOUT CONTRAST
TECHNIQUE: Multiplanar, multiecho pulse sequences of the brain and surrounding
structures were obtained without intravenous contrast.

[Series 5: swi_images · axial · 1.5mm · 0.90mm/px · z∈[-71,+70]mm · 13 of 96 slices shown]
[im 1/96]
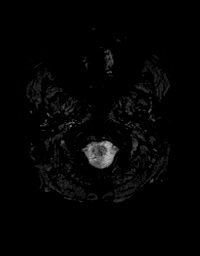
[im 8/96]
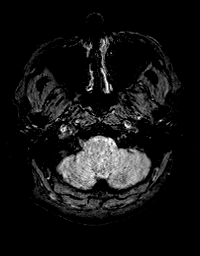
[im 16/96]
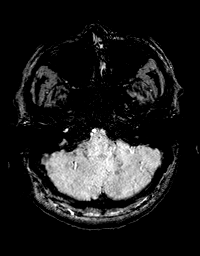
[im 24/96]
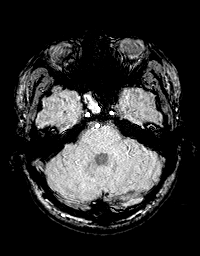
[im 32/96]
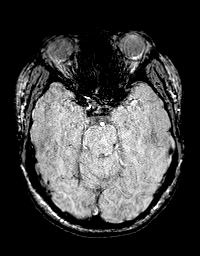
[im 40/96]
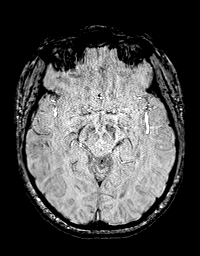
[im 48/96]
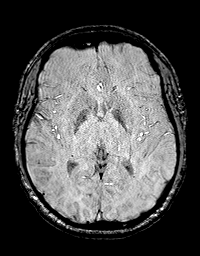
[im 56/96]
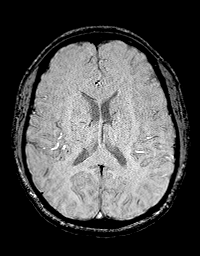
[im 64/96]
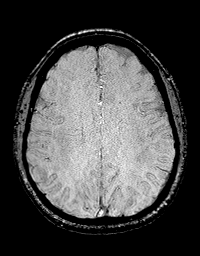
[im 72/96]
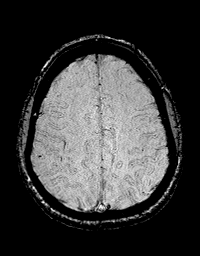
[im 80/96]
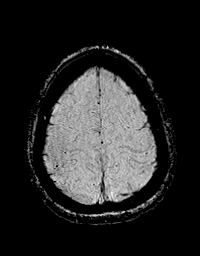
[im 88/96]
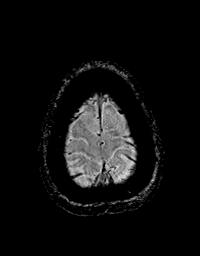
[im 96/96]
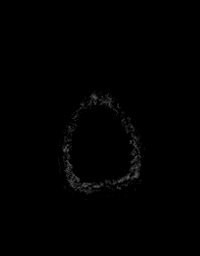

[Series 6: mip_images(sw) · axial · 12.0mm · 0.90mm/px · z∈[-66,+65]mm · 11 of 89 slices shown]
[im 1/89]
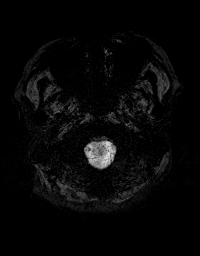
[im 9/89]
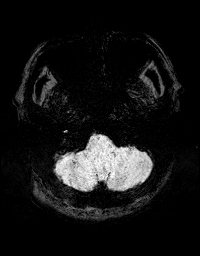
[im 18/89]
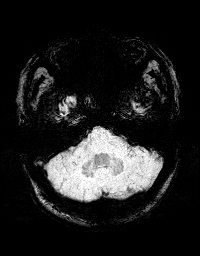
[im 27/89]
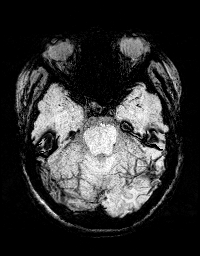
[im 36/89]
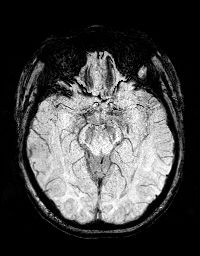
[im 45/89]
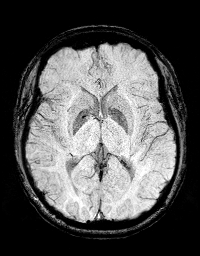
[im 53/89]
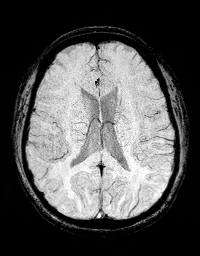
[im 62/89]
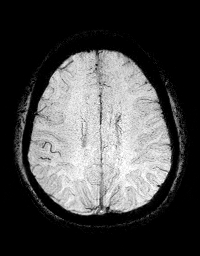
[im 71/89]
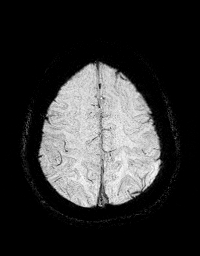
[im 80/89]
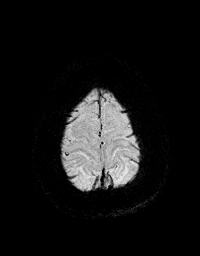
[im 89/89]
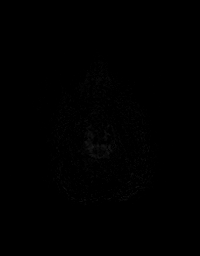

[Series 7: T1 · axial · 1.0mm · 0.94mm/px · z∈[-80,+79]mm · 20 of 160 slices shown]
[im 1/160]
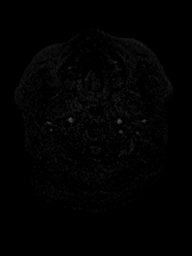
[im 9/160]
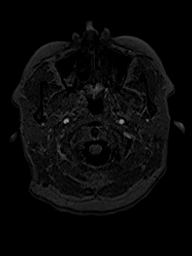
[im 17/160]
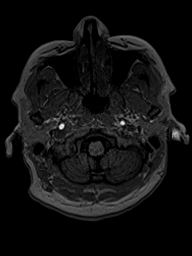
[im 26/160]
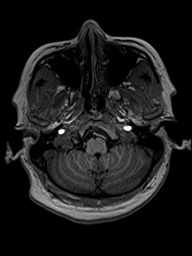
[im 34/160]
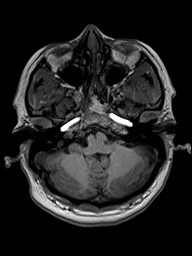
[im 42/160]
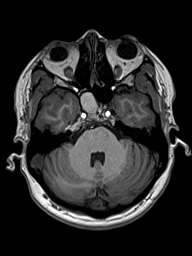
[im 51/160]
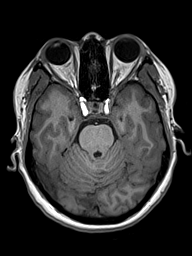
[im 59/160]
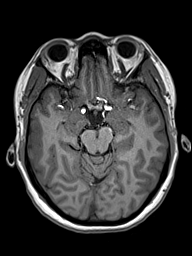
[im 67/160]
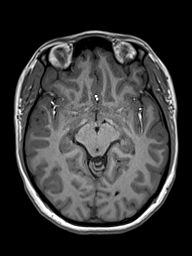
[im 76/160]
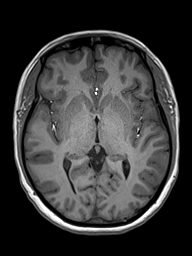
[im 84/160]
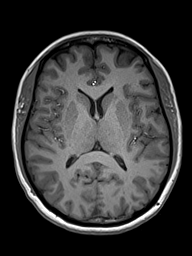
[im 93/160]
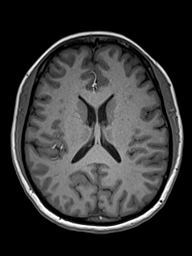
[im 101/160]
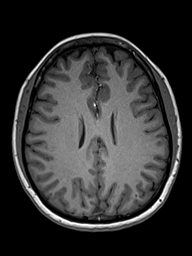
[im 109/160]
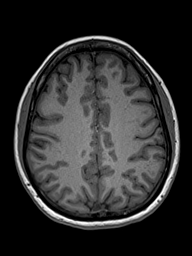
[im 118/160]
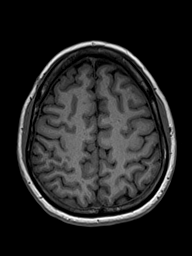
[im 126/160]
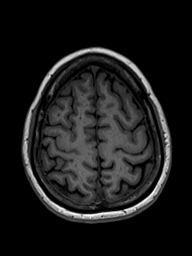
[im 134/160]
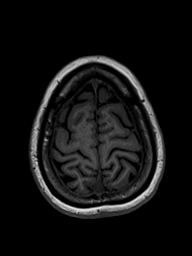
[im 143/160]
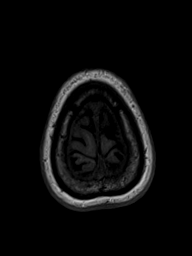
[im 151/160]
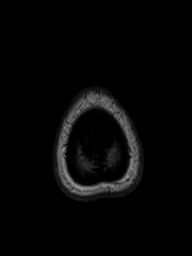
[im 160/160]
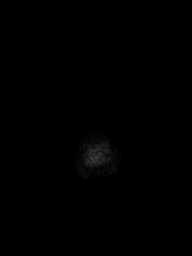

[Series 8: T2 · coronal · 4.5mm · 0.36mm/px · 4 of 30 slices shown]
[im 1/30]
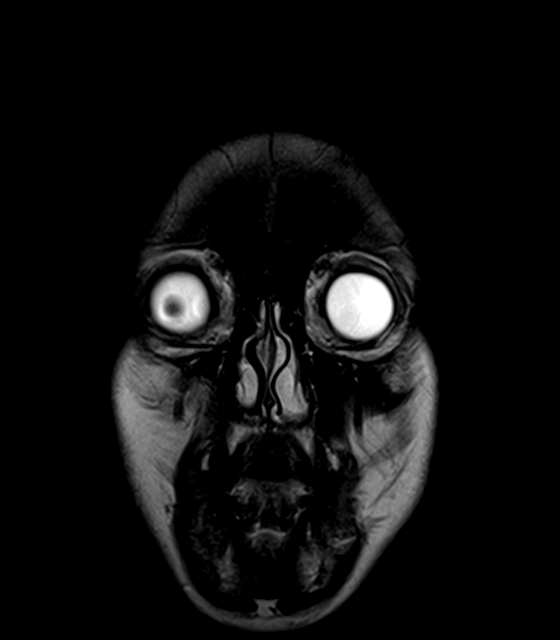
[im 10/30]
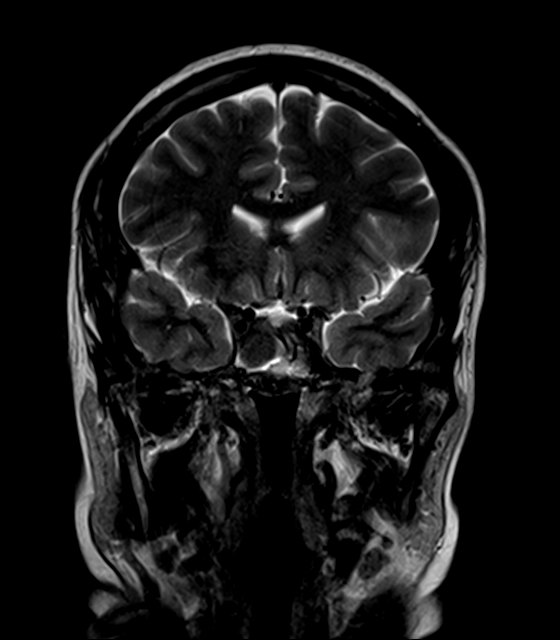
[im 20/30]
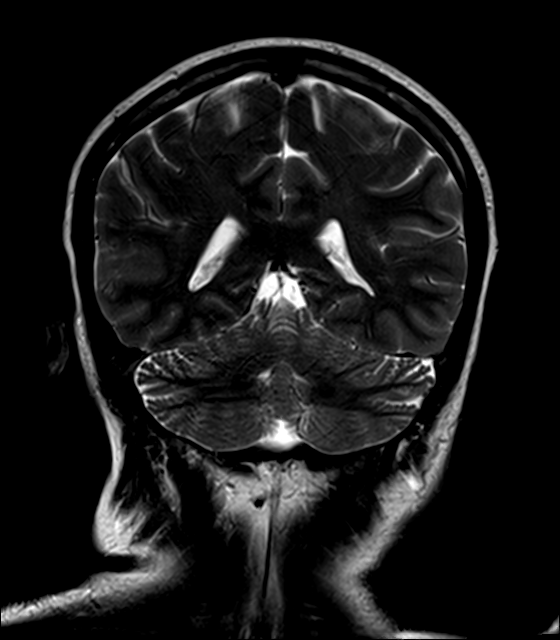
[im 30/30]
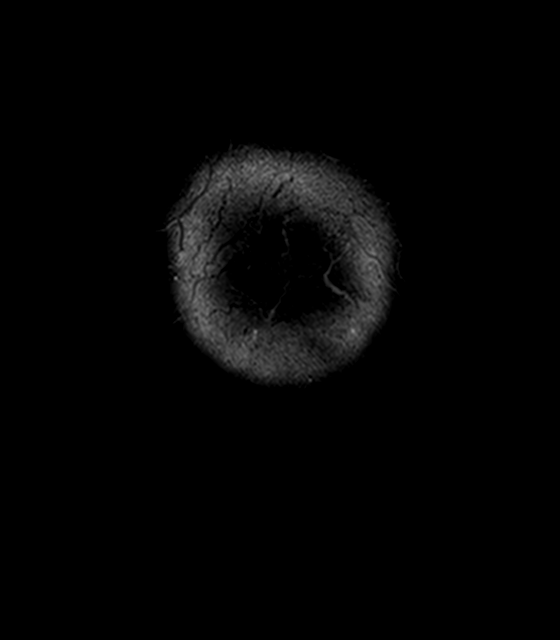

[48 of 48 positions shown; findings below may reference images not displayed]

FINDINGS: Brain: No acute infarction, hemorrhage, hydrocephalus, extra-axial
collection or mass lesion.

Vascular: Normal flow voids.

Skull and upper cervical spine: Normal marrow signal.

Sinuses/Orbits: T1 hyper and T2 hypointense ovoid structure in the
right sphenoid sinus with peripheral T2 hyperintense mucosal
thickening, most consistent with retention cyst.
IMPRESSION: 1. Negative MRI of the brain.
2. Opacified right sphenoid sinus with features of retention cyst
and mucosal thickening.

## 2022-11-07 ENCOUNTER — Encounter (HOSPITAL_COMMUNITY): Payer: Self-pay

## 2022-11-07 ENCOUNTER — Ambulatory Visit (HOSPITAL_COMMUNITY)
Admission: EM | Admit: 2022-11-07 | Discharge: 2022-11-07 | Disposition: A | Discharge status: Home / Self Care | Payer: 59 | Attending: Family Medicine | Admitting: Family Medicine

## 2022-11-07 DIAGNOSIS — J0100 Acute maxillary sinusitis, unspecified: Secondary | ICD-10-CM | POA: Diagnosis not present

## 2022-11-07 MED ORDER — PREDNISONE 10 MG (21) PO TBPK: ORAL_TABLET | Freq: Every day | ORAL | 0 refills | Status: AC

## 2022-11-07 MED ORDER — AZITHROMYCIN 250 MG PO TABS: 250.0000 mg | ORAL_TABLET | Freq: Every day | ORAL | 0 refills | Status: AC

## 2022-11-07 NOTE — ED Triage Notes (Signed)
Pt is here for possible sinus infection x 10 days

## 2022-11-08 NOTE — ED Provider Notes (Signed)
[place]   [id number] 11/07/22 Arrival Time: 1937  ASSESSMENT & PLAN:  1. Acute non-recurrent maxillary sinusitis    Reports [email or web address] work for her sinus infections; usu seen by asthma/allergy.  Meds ordered this encounter  Medications   azithromycin (ZITHROMAX) 250 MG tablet    Sig: Take 1 tablet (250 mg total) by mouth daily. Take first 2 tablets together, then 1 every day until finished.    Dispense:  6 tablet    Refill:  0   predniSONE (STERAPRED UNI-PAK 21 TAB) 10 MG (21) TBPK tablet    Sig: Take by mouth daily. Take as directed.    Dispense:  21 tablet    Refill:  0   OTC symptom care as needed. Ensure adequate fluid intake and rest.   Follow-up Information     Koirala, Dibas, MD.   Specialty: Family Medicine Why: As needed. Contact information: [place] [address] [phone]                 Reviewed expectations re: course of current medical issues. Questions answered. Outlined signs and symptoms indicating need for more acute intervention. Patient verbalized understanding. After Visit Summary given.   SUBJECTIVE: History from: patient.  Catherine Bryan is a 47 y.o. female who presents with complaint of nasal congestion, post-nasal drainage, and sinus pain. Onset gradual,  > 1 wk . Respiratory symptoms: none. Fever: absent. Overall normal PO intake without n/v.  History of frequent sinus infections: "occasional". No specific aggravating or alleviating factors reported. Social History   Tobacco Use  Smoking Status Never  Smokeless Tobacco Never   OBJECTIVE:  Vitals:   11/07/22 2035  BP: (!) 150/101  Pulse: 91  Resp: 16  Temp: (!) 91 F (32.8 C)  TempSrc: Oral  SpO2: 98%     General appearance: alert; no distress HEENT: nasal congestion; clear runny nose; throat irritation secondary to post-nasal drainage; bilateral maxillary tenderness to palpation; turbinates boggy Neck: supple  without LAD; trachea midline Lungs: unlabored respirations, symmetrical air entry; cough: absent; no respiratory distress Skin: warm and dry Psychological: alert and cooperative; normal mood and affect  Allergies  Allergen Reactions   Amoxicillin-Pot Clavulanate Nausea And Vomiting   Cefdinir Diarrhea and Nausea And Vomiting    N/V N/V     Past Medical History:  Diagnosis Date   Kidney stones    Ovarian cyst    Family History  Problem Relation Age of Onset   Hypertension Mother    Anemia Mother    Hypertension Father    Bipolar disorder Sister    Hypertension Sister    Heart disease Sister    Lung cancer Maternal Grandmother    Hypertension Maternal Grandfather    Stroke Maternal Grandfather    Heart attack Paternal Grandmother    Social History   Socioeconomic History   Marital status: Married    Spouse name: Not on file   Number of children: Not on file   Years of education: Not on file   Highest education level: Not on file  Occupational History   Not on file  Tobacco Use   Smoking status: Never   Smokeless tobacco: Never  Substance and Sexual Activity   Alcohol use: Never   Drug use: Never   Sexual activity: Not on file  Other Topics Concern   Not on file  Social History Narrative   Not on file   Social Determinants of Health   Financial Resource Strain:  Not on file  Food Insecurity: Not on file  Transportation Needs: Not on file  Physical Activity: Not on file  Stress: Not on file  Social Connections: Not on file  Intimate Partner Violence: Not on file             Vanessa Kick, MD 11/08/22 [phone]

## 2024-11-01 ENCOUNTER — Emergency Department (HOSPITAL_BASED_OUTPATIENT_CLINIC_OR_DEPARTMENT_OTHER)
Admission: EM | Admit: 2024-11-01 | Discharge: 2024-11-01 | Disposition: A | Discharge status: Home / Self Care | Attending: Emergency Medicine | Admitting: Emergency Medicine

## 2024-11-01 ENCOUNTER — Emergency Department (HOSPITAL_BASED_OUTPATIENT_CLINIC_OR_DEPARTMENT_OTHER)

## 2024-11-01 ENCOUNTER — Other Ambulatory Visit: Payer: Self-pay

## 2024-11-01 ENCOUNTER — Encounter (HOSPITAL_BASED_OUTPATIENT_CLINIC_OR_DEPARTMENT_OTHER): Payer: Self-pay | Admitting: *Deleted

## 2024-11-01 DIAGNOSIS — R109 Unspecified abdominal pain: Secondary | ICD-10-CM

## 2024-11-01 DIAGNOSIS — R197 Diarrhea, unspecified: Secondary | ICD-10-CM | POA: Insufficient documentation

## 2024-11-01 DIAGNOSIS — R1013 Epigastric pain: Secondary | ICD-10-CM | POA: Insufficient documentation

## 2024-11-01 DIAGNOSIS — R1030 Lower abdominal pain, unspecified: Secondary | ICD-10-CM | POA: Diagnosis present

## 2024-11-01 LAB — URINALYSIS, ROUTINE W REFLEX MICROSCOPIC
Bacteria, UA: NONE SEEN
Bilirubin Urine: NEGATIVE
Glucose, UA: NEGATIVE mg/dL
Hgb urine dipstick: NEGATIVE
Ketones, ur: NEGATIVE mg/dL
Leukocytes,Ua: NEGATIVE
Nitrite: NEGATIVE
Specific Gravity, Urine: 1.027 (ref 1.005–1.030)
pH: 7 (ref 5.0–8.0)

## 2024-11-01 LAB — COMPREHENSIVE METABOLIC PANEL WITH GFR
ALT: 13 U/L (ref 0–44)
AST: 22 U/L (ref 15–41)
Albumin: 4.5 g/dL (ref 3.5–5.0)
Alkaline Phosphatase: 65 U/L (ref 38–126)
Anion gap: 14 (ref 5–15)
BUN: 14 mg/dL (ref 6–20)
CO2: 22 mmol/L (ref 22–32)
Calcium: 9.3 mg/dL (ref 8.9–10.3)
Chloride: 104 mmol/L (ref 98–111)
Creatinine, Ser: 0.84 mg/dL (ref 0.44–1.00)
GFR, Estimated: >60 mL/min
Glucose, Bld: 111 mg/dL — ABNORMAL HIGH (ref 70–99)
Potassium: 3.8 mmol/L (ref 3.5–5.1)
Sodium: 140 mmol/L (ref 135–145)
Total Bilirubin: 0.7 mg/dL (ref 0.0–1.2)
Total Protein: 7.4 g/dL (ref 6.5–8.1)

## 2024-11-01 LAB — CBC
HCT: 43.7 % (ref 36.0–46.0)
Hemoglobin: 14.5 g/dL (ref 12.0–15.0)
MCH: 30.1 pg (ref 26.0–34.0)
MCHC: 33.2 g/dL (ref 30.0–36.0)
MCV: 90.7 fL (ref 80.0–100.0)
Platelets: 195 K/uL (ref 150–400)
RBC: 4.82 MIL/uL (ref 3.87–5.11)
RDW: 13.7 % (ref 11.5–15.5)
WBC: 10.5 K/uL (ref 4.0–10.5)
nRBC: 0 % (ref 0.0–0.2)

## 2024-11-01 LAB — PREGNANCY, URINE: Preg Test, Ur: NEGATIVE

## 2024-11-01 LAB — LIPASE, BLOOD: Lipase: 24 U/L (ref 11–51)

## 2024-11-01 MED ORDER — ONDANSETRON HCL 4 MG/2ML IJ SOLN
4.0000 mg | Freq: Once | INTRAMUSCULAR | Status: AC
  Administered 2024-11-01: 4 mg via INTRAVENOUS
  Filled 2024-11-01: qty 2

## 2024-11-01 MED ORDER — IOHEXOL 300 MG/ML  SOLN
100.0000 mL | Freq: Once | INTRAMUSCULAR | Status: AC | PRN
  Administered 2024-11-01: 80 mL via INTRAVENOUS

## 2024-11-01 MED ORDER — MORPHINE SULFATE (PF) 4 MG/ML IV SOLN
6.0000 mg | Freq: Once | INTRAVENOUS | Status: AC
  Administered 2024-11-01: 6 mg via INTRAVENOUS
  Filled 2024-11-01: qty 2

## 2024-11-01 MED ORDER — PANTOPRAZOLE SODIUM 40 MG PO TBEC: 40.0000 mg | DELAYED_RELEASE_TABLET | Freq: Every day | ORAL | 0 refills | Status: AC

## 2024-11-01 NOTE — ED Triage Notes (Signed)
 Pt reports pain in the upper abdomen that goes down through her bilateral lower abdomen. Her stomach has felt off for a couple of days, pain just started today. Reports hx of diverticulitis and it feels similar. Nausea and diarrhea. Denies fevers.

## 2024-11-01 NOTE — Discharge Instructions (Addendum)
 The radiologist saw the following on your CAT scan today:  Indeterminate 1.6 cm hyperdense lesion in the anterior interpolar right  kidney, favored to represent a hemorrhagic cyst. Follow-up MR abdomen  with/without contrast is suggested in 3-6 months.   Please contact your doctor to schedule a follow-up MRI as mentioned above.  Remainder your CAT scan was negative.  Return here for fever, vomiting, or any other problems

## 2024-11-01 NOTE — ED Provider Notes (Signed)
 " [place] EMERGENCY DEPARTMENT AT [facility] Provider Note   CSN: [id number] Arrival date & time: 11/01/24  2038     Patient presents with: Abdominal Pain   Catherine Bryan is a 49 y.o. female.   49 year old female who presents with lower abdominal discomfort which began yesterday.  Pain with bilateral lower abdomen and radiates up into her upper abdomen.  She has had nausea no vomiting.  No fever or chills.  Some loose stools.  History of diverticulitis and is for similar.  No treatment used prior to arrival       Prior to Admission medications  Medication Sig Start Date End Date Taking? Authorizing Provider  albuterol (PROVENTIL HFA;VENTOLIN HFA) 108 (90 Base) MCG/ACT inhaler Inhale 2 puffs into the lungs every 4 (four) hours as needed for wheezing or shortness of breath.    [provider]  azelastine (ASTELIN) 0.1 % nasal spray Place 1 spray into both nostrils 2 (two) times daily. Use in each nostril as directed    [provider]  azithromycin  (ZITHROMAX ) 250 MG tablet Take 1 tablet (250 mg total) by mouth daily. Take first 2 tablets together, then 1 every day until finished. 11/07/22   Rolinda Rogue, MD  famotidine (PEPCID) 40 MG tablet Take 40 mg by mouth daily.    [provider]  fluocinonide cream (LIDEX) 0.05 % Apply 1 application topically 2 (two) times daily as needed. Eczema. 10/13/18   [provider]  fluticasone (CUTIVATE) 0.05 % cream Apply 1 application topically 2 (two) times daily as needed. Eczema. 10/14/18   [provider]  fluticasone (FLONASE) 50 MCG/ACT nasal spray Place 1 spray into both nostrils 2 (two) times daily.    [provider]  levocetirizine (XYZAL) 5 MG tablet Take 5 mg by mouth every evening.    [provider]  levonorgestrel-ethinyl estradiol (SEASONALE,INTROVALE,JOLESSA) 0.15-0.03 MG tablet Take 1 tablet by mouth daily. 11/18/18   [provider]  montelukast  (SINGULAIR) 10 MG tablet Take 10 mg by mouth at bedtime.    [provider]  nabumetone (RELAFEN) 500 MG tablet Take 500 mg by mouth 2 (two) times daily as needed. 11/05/19   [provider]  predniSONE  (STERAPRED UNI-PAK 21 TAB) 10 MG (21) TBPK tablet Take by mouth daily. Take as directed. 11/07/22   Rolinda Rogue, MD  Vitamin D, Ergocalciferol, (DRISDOL) 1.25 MG (50000 UNIT) CAPS capsule Take 50,000 Units by mouth once a week. 11/06/19   [provider]  zonisamide (ZONEGRAN) 100 MG capsule Take 100 mg by mouth at bedtime. 10/29/19   [provider]  zonisamide (ZONEGRAN) 50 MG capsule Take 50 mg by mouth daily.    [provider]    Allergies: Amoxicillin-pot clavulanate and Cefdinir    Review of Systems  All other systems reviewed and are negative.   Updated Vital Signs BP (!) 142/84   Pulse 98   Temp 98.3 F (36.8 C)   Resp 16   Ht 1.575 m (5' 2)   Wt 65.3 kg   LMP 10/18/2024   SpO2 100%   BMI 26.34 kg/m   Physical Exam Vitals and nursing note reviewed.  Constitutional:      General: She is not in acute distress.    Appearance: Normal appearance. She is well-developed. She is not toxic-appearing.  HENT:     Head: Normocephalic and atraumatic.  Eyes:     General: Lids are normal.     Conjunctiva/sclera: Conjunctivae normal.  Pupils: Pupils are equal, round, and reactive to light.  Neck:     Thyroid: No thyroid mass.     Trachea: No tracheal deviation.  Cardiovascular:     Rate and Rhythm: Normal rate and regular rhythm.     Heart sounds: Normal heart sounds. No murmur heard.    No gallop.  Pulmonary:     Effort: Pulmonary effort is normal. No respiratory distress.     Breath sounds: Normal breath sounds. No stridor. No decreased breath sounds, wheezing, rhonchi or rales.  Abdominal:     General: There is no distension.     Palpations: Abdomen is soft.     Tenderness: There is abdominal tenderness in the epigastric  area. There is no rebound.  Musculoskeletal:        General: No tenderness. Normal range of motion.     Cervical back: Normal range of motion and neck supple.  Skin:    General: Skin is warm and dry.     Findings: No abrasion or rash.  Neurological:     Mental Status: She is alert and oriented to person, place, and time. Mental status is at baseline.     GCS: GCS eye subscore is 4. GCS verbal subscore is 5. GCS motor subscore is 6.     Cranial Nerves: No cranial nerve deficit.     Sensory: No sensory deficit.     Motor: Motor function is intact.  Psychiatric:        Attention and Perception: Attention normal.        Speech: Speech normal.        Behavior: Behavior normal.     (all labs ordered are listed, but only abnormal results are displayed) Labs Reviewed  LIPASE, BLOOD  COMPREHENSIVE METABOLIC PANEL WITH GFR  CBC  URINALYSIS, ROUTINE W REFLEX MICROSCOPIC  PREGNANCY, URINE    EKG: None  Radiology: No results found.   Procedures   Medications Ordered in the ED  morphine  (PF) 4 MG/ML injection 6 mg (has no administration in time range)                                    Medical Decision Making Amount and/or Complexity of Data Reviewed Labs: ordered. Radiology: ordered.  Risk Prescription drug management.   Patient given morphine  for pain and feels better.  Labs here are reassuring.  CT scan shows no evidence of diverticular disease.  It did show a likely hemorrhagic cyst on her right kidney.  Will need follow-up in 3 to 6 months.  Will inform the patient.  Suspect the patient has some GERD as cause of her symptoms.  Will place on PPI at discharge    Final diagnoses:  None    ED Discharge Orders     None          Dasie Faden, MD 11/01/24 2240  "

## 2024-11-24 ENCOUNTER — Other Ambulatory Visit (HOSPITAL_COMMUNITY): Payer: Self-pay | Admitting: Gastroenterology

## 2024-11-24 DIAGNOSIS — R1011 Right upper quadrant pain: Secondary | ICD-10-CM
